# Patient Record
Sex: Male | Born: 1985 | Race: Black or African American | Hispanic: No | Marital: Single | Smoking: Current every day smoker
Health system: Southern US, Community
[De-identification: ages and names within clinical notes are randomized; demographics above are authoritative.]

## PROBLEM LIST (undated history)

## (undated) HISTORY — PX: APPENDECTOMY: SHX54

---
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
## 2013-05-29 ENCOUNTER — Emergency Department (HOSPITAL_COMMUNITY): Payer: Self-pay

## 2013-05-29 ENCOUNTER — Emergency Department (HOSPITAL_COMMUNITY)
Admission: EM | Admit: 2013-05-29 | Discharge: 2013-05-29 | Disposition: A | Discharge status: Home / Self Care | Payer: Self-pay | Attending: Emergency Medicine | Admitting: Emergency Medicine

## 2013-05-29 ENCOUNTER — Encounter (HOSPITAL_COMMUNITY): Payer: Self-pay | Admitting: Emergency Medicine

## 2013-05-29 DIAGNOSIS — R5383 Other fatigue: Secondary | ICD-10-CM

## 2013-05-29 DIAGNOSIS — J111 Influenza due to unidentified influenza virus with other respiratory manifestations: Secondary | ICD-10-CM | POA: Insufficient documentation

## 2013-05-29 DIAGNOSIS — F17200 Nicotine dependence, unspecified, uncomplicated: Secondary | ICD-10-CM | POA: Insufficient documentation

## 2013-05-29 DIAGNOSIS — R5381 Other malaise: Secondary | ICD-10-CM | POA: Insufficient documentation

## 2013-05-29 DIAGNOSIS — R197 Diarrhea, unspecified: Secondary | ICD-10-CM | POA: Insufficient documentation

## 2013-05-29 DIAGNOSIS — R51 Headache: Secondary | ICD-10-CM | POA: Insufficient documentation

## 2013-05-29 DIAGNOSIS — R6889 Other general symptoms and signs: Secondary | ICD-10-CM

## 2013-05-29 LAB — RAPID STREP SCREEN (MED CTR MEBANE ONLY): Streptococcus, Group A Screen (Direct): NEGATIVE

## 2013-05-29 MED ORDER — IBUPROFEN 800 MG PO TABS
800.0000 mg | ORAL_TABLET | Freq: Once | ORAL | Status: AC
  Administered 2013-05-29: 800 mg via ORAL
  Filled 2013-05-29: qty 1

## 2013-05-29 MED ORDER — ONDANSETRON HCL 4 MG PO TABS: 4.0000 mg | ORAL_TABLET | Freq: Four times a day (QID) | ORAL | Status: DC | PRN

## 2013-05-29 MED ORDER — ONDANSETRON 4 MG PO TBDP
4.0000 mg | ORAL_TABLET | Freq: Once | ORAL | Status: AC
  Administered 2013-05-29: 4 mg via ORAL
  Filled 2013-05-29: qty 1
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
## 2013-05-29 NOTE — ED Notes (Signed)
Pt reports this week, fever, runny nose, h/a, aching. Vomiting and diarrhea started today. Pt is a x 4. In NAD. Mask in place.

## 2013-05-29 NOTE — Discharge Instructions (Signed)
°Emergency Department Resource Guide °1) Find a Doctor and Pay Out of Pocket °Although you won't have to find out who is covered by your insurance plan, it is a good idea to ask around and get recommendations. You will then need to call the office and see if the doctor you have chosen will accept you as a new patient and what types of options they offer for patients who are self-pay. Some doctors offer discounts or will set up payment plans for their patients who do not have insurance, but you will need to ask so you aren't surprised when you get to your appointment. ° °2) Contact Your Local Health Department °Not all health departments have doctors that can see patients for sick visits, but many do, so it is worth a call to see if yours does. If you don't know where your local health department is, you can check in your phone book. The CDC also has a tool to help you locate your state's health department, and many state websites also have listings of all of their local health departments. ° °3) Find a Walk-in Clinic °If your illness is not likely to be very severe or complicated, you may want to try a walk in clinic. These are popping up all over the country in pharmacies, drugstores, and shopping centers. They're usually staffed by nurse practitioners or physician assistants that have been trained to treat common illnesses and complaints. They're usually fairly quick and inexpensive. However, if you have serious medical issues or chronic medical problems, these are probably not your best option. ° °No Primary Care Doctor: °- Call [facility] at  832-8000 - they can help you locate a primary care doctor that  accepts your insurance, provides certain services, etc. °- Physician Referral Service- 1-800-533-3463 ° °Chronic Pain Problems: °Organization         Address  Phone   Notes  °[facility]  (336) 297-2271 Patients need to be referred by their primary care doctor.  ° °Medication  Assistance: °Organization         Address  Phone   Notes  °[place] [facility] [address] °[place], [place] [address] (336) 641-8030 --Must be a resident of [place] °-- Must have NO insurance coverage whatsoever (no Medicaid/ Medicare, etc.) °-- The pt. MUST have a primary care doctor that directs their care regularly and follows them in the community °  °[facility]  (866) 331-1348   °[employer]  (888) 892-1162   ° °Agencies that provide inexpensive medical care: °Organization         Address  Phone   Notes  °[facility]  (336) 832-8035   °[facility]    (336) 832-7272   °[facility] [address] °[place], [place] [address] (336) 832-4777   °[facility] [address], °[place] (336) 271-4999   °[facility]    (336) 373-0678   °[facility]    (336) 272-1050   °[facility] ° [address], [place] Phone:  (336) 832-4444, Fax:  (336) 832-4440 Hours of Operation:  9 am - 6 pm, M-F.  Also accepts Medicaid/Medicare and self-pay.  °[facility] ° [address], [place] Phone: (336) 832-3150, Fax: (336) 832-3151. Hours of Operation:  8:30 am - 5:30 pm, M-F.  Also accepts Medicaid and self-pay.  °[facility] [place] [address], [place] Phone: (336) 878-6027   °[facility] [address], [place], [place] (336)723-1848, Ext. 123 Mondays & Thursdays: 7-9 AM.  First 15 patients are seen on a first come, first serve basis. °  ° °Medicaid-accepting [place] Providers: ° °Organization         Address  Phone   Notes  °[facility] [address], [place] (336) 641-2100 Also accepts self-pay patients.  °[facility] [address], [place] ° (336) 856-9996   °[facility] [address], [place]  (336) 288-8857   °[facility] [address] [place] [address], [place] (336) 299-7000   °Veita Bland [address], [place]  ° (336) 373-1557 Only accepts [place] Access Medicaid patients after they have their name applied to their card.  ° °Self-Pay (no insurance) in [place]: ° °Organization         Address  Phone   Notes  °Sickle Cell Patients, [facility] [address], [place] (336) 832-1970   °[facility] [address], [place] (336) 832-4400   °[facility] ° [address], [place] (336) 992-4800   °[facility]/Dr. Osei-Bonsu ° [address] [place] [address], [place] or [address], [place] (336) 841-8500 Phone number for both [place] and [place] locations is the same.  °[facility] [address], [place] (336) 299-0000   °[facility] [address] [place] [address], [place] or [address] (336) 852-7530 °(336) 878-2260   °[facility] [address], [place] (336) 350-1642, phone; (336) 294-5005, fax Sees patients 1st and 3rd Saturday of every month.  Must not qualify for public or private insurance (i.e. Medicaid, Medicare, [place] Health Choice, Veterans' Benefits) • Household income should be no more than 200% of the poverty level •The clinic cannot treat you if you are pregnant or think you are pregnant • Sexually transmitted diseases are not treated at the clinic.  ° ° °Dental Care: °Organization         Address  Phone  Notes  °[place] [facility] [address], [place] (336) 641-6152 Accepts children up to age 21 who are enrolled in Medicaid or [place] Health Choice; pregnant women with a Medicaid card; and children who have applied for Medicaid or [place] Health Choice, but were declined, whose parents can pay a reduced fee at time of service.  °[place]  [facility] [place]  [address], [place] (336) 641-7733 Accepts children up to age 21 who are enrolled in Medicaid or [place] Health Choice; pregnant women with a Medicaid card; and children who have applied for Medicaid or [place] Health Choice, but were declined, whose parents can pay a reduced fee at time of service.  °[facility] ° [address], [place] (336) 641-4533 Patients are seen by appointment only. Walk-ins are not accepted. [facility] will see patients 18 years of age and older. °Monday - Tuesday (8am-5pm) °Most Wednesdays (8:30-5pm) °$30 per visit, cash only  °[facility] ° [address], [place] (336) 641-4533 Patients are seen by appointment only. Walk-ins are not accepted. [facility] will see patients 18 years of age and older. °One   Wednesday Evening (Monthly: Volunteer Based).  $30 per visit, cash only  °[facility]  (919) 537-3737 for adults; Children under age 4, call Graduate Pediatric Dentistry at (919) [phone]. Children aged 4-14, please call (919) [phone] to request a pediatric application. ° Dental services are provided in all areas of dental care including fillings, crowns and bridges, complete and partial dentures, implants, gum treatment, root canals, and extractions. Preventive care is also provided. Treatment is provided to both adults and children. °Patients are selected via a lottery and there is often a waiting list. °  °[facility] [address], °[place] ° (336) 763-8833 [email or web address] °  °[facility] [address], [place], [place] (336)723-1848, Ext. 123 Second and Fourth Thursday of each month, opens at 6:30 AM; Clinic ends at 9 AM.  Patients are seen on a first-come first-served basis, and a limited number are seen during each clinic.  ° °[facility] ° [address], [place], [place] (336) 723-7904    Eligibility Requirements °You must have lived in [place], [place], or [place] counties for at least the last three months. °  You cannot be eligible for state or federal sponsored healthcare insurance, including Veterans Administration, Medicaid, or Medicare. °  You generally cannot be eligible for healthcare insurance through your employer.  °  How to apply: °Eligibility screenings are held every Tuesday and Wednesday afternoon from 1:00 pm until 4:00 pm. You do not need an appointment for the interview!  °[facility] [address], [place], [place] 336-631-2330   °[facility] [facility]  336-342-8273   °[place] [facility]  336-703-3100   °[facility]  336-570-6415   ° °Behavioral Health Resources in the Community: °Intensive Outpatient Programs °Organization         Address  Phone  Notes  °[facility] [address], [place], [place] 336-878-6098   °[facility] [address], [place], [place] 336-832-9800   °[facility] [address], [place], [place] ° 336-882-2125   °[facility] [address],  °[place], [place] 1-800-853-5163 or 336-641-4981   °Substance Abuse Resources °Organization         Address  Phone  Notes  °[facility]  336-882-2125   °[facility]  336-784-9470   °[facility]  336-285-9073   °[facility]  336-845-3988   °Residential & Outpatient Substance Abuse Program  1-800-659-3381   °Psychological Services °Organization         Address  Phone  Notes  °[facility]  336- 832-9600   °[facility]  336- 378-7881   °[facility] [address], [place] 1-800-853-5163 or 336-641-4981   ° °Mobile Crisis Teams °Organization         Address  Phone  Notes  °[facility], Mobile Crisis Care Unit  1-877-626-1772   °Assertive °[facility] ° [address]  [place], [place] 336-834-9664   °Sharon DeEsch [address] °[place] 336-554-5454   ° °Self-Help/Support Groups °Organization         Address  Phone             Notes  °[facility] [place] - variety of support groups  336- 373-1402 Call for more information  °Narcotics Anonymous (NA), [facility] [address], °[place]  2 meetings at this location  ° °  Residential Treatment Programs °Organization         Address  Phone  Notes  °[facility] [address],    °[place]  1-866-801-8205   °[facility] ° [address], [place], [place] 704-293-8524   °[facility] [address], [place] 336-845-3988 Admissions: 8am-3pm M-F  °[facility] [address]    °[place], [place] 336-841-1104   °[facility] [address], [place], [place] 336-379-7146   °[facility] [address]  °[place], [place] 336-285-9073   °[facility] - Intensive Outpatient [address], [place], [place] 336-852-3033   °[facility] [address]  °[place], [place] 1-877-615-2722 or 336-784-9470   °[facility] [address] [place], [place] 336-227-7417 Accepts Medicaid  °[facility] [address]  °[place] 1-800-659-3381 Substance Abuse/Addiction Treatment  ° °[place] Behavioral Health Resources °Organization         Address  Phone  Notes  °[facility]  (888) 581-9988   °Julie Brannon, PhD [address] [place], [place]   (336) 349-5553 or (336) 951-0000   °[facility]   [address] °[place], [place] (336) 349-4454   °[facility] [address], [place], [place] (336) 342-8316 Insurance/Medicaid/sponsorship through [facility]  °[facility] [address]                                    [place], [place] (336) 342-8316 Therapy/tele-psych/case    °[facility] [address]  ° [place],  (336) 349-2233    °Dr. Arfeen  (336) 349-4544   °[facility] [place]  [employer] [place] [facility] 1) [address], [place] °2) [address], [place] °3)  [address], [place] (336) 349-3220 °(336) 342-7768 ° °(336) 342-8140   °[place] [facility] (336) 342-1394 or (336) 342-3537 (After Hours)    ° ° °

## 2013-05-31 LAB — CULTURE, GROUP A STREP

## 2014-07-20 ENCOUNTER — Emergency Department (INDEPENDENT_AMBULATORY_CARE_PROVIDER_SITE_OTHER)
Admission: EM | Admit: 2014-07-20 | Discharge: 2014-07-20 | Disposition: A | Discharge status: Home / Self Care | Payer: Self-pay | Source: Home / Self Care | Attending: Family Medicine | Admitting: Family Medicine

## 2014-07-20 ENCOUNTER — Encounter (HOSPITAL_COMMUNITY): Payer: Self-pay | Admitting: Emergency Medicine

## 2014-07-20 ENCOUNTER — Other Ambulatory Visit (HOSPITAL_COMMUNITY)
Admission: RE | Admit: 2014-07-20 | Discharge: 2014-07-20 | Disposition: A | Discharge status: Home / Self Care | Payer: Self-pay | Source: Ambulatory Visit | Attending: Family Medicine | Admitting: Family Medicine

## 2014-07-20 DIAGNOSIS — Z113 Encounter for screening for infections with a predominantly sexual mode of transmission: Secondary | ICD-10-CM

## 2014-07-20 NOTE — Discharge Instructions (Signed)

## 2014-07-20 NOTE — ED Notes (Signed)
Patient reports he would like to be checked for STDs. Denies having any symptoms. Just wants to be checked for everything.

## 2014-07-20 NOTE — ED Provider Notes (Signed)
CSN: [id number]     Arrival date & time 07/20/14  0809 History   None    Chief Complaint  Patient presents with  . Exposure to STD   (Consider location/radiation/quality/duration/timing/severity/associated sxs/prior Treatment) HPI          29 year old heterosexual male presents requesting STD screening. Denies any exposures has no symptoms. Denies high-risk sexual behavior  History reviewed. No pertinent past medical history. Past Surgical History  Procedure Laterality Date  . Appendectomy     No family history on file. History  Substance Use Topics  . Smoking status: Current Every Day Smoker  . Smokeless tobacco: Not on file  . Alcohol Use: No    Review of Systems  Constitutional: Negative for fever, chills and fatigue.  HENT: Negative for sore throat.   Eyes: Negative for visual disturbance.  Respiratory: Negative for cough and shortness of breath.   Cardiovascular: Negative for chest pain, palpitations and leg swelling.  Gastrointestinal: Negative for nausea, vomiting, abdominal pain, diarrhea and constipation.  Genitourinary: Negative for dysuria, urgency, frequency and hematuria.  Musculoskeletal: Negative for myalgias, arthralgias, neck pain and neck stiffness.  Skin: Negative for rash.  Neurological: Negative for dizziness, weakness and light-headedness.  All other systems reviewed and are negative.   Allergies  Review of patient's allergies indicates no known allergies.  Home Medications   Prior to Admission medications   Medication Sig Start Date End Date Taking? Authorizing Provider  Acetaminophen (CHLORASEPTIC SORE THROAT PO) Take 1-2 sprays by mouth every 4 (four) hours as needed (sore throat).    Historical Provider, MD  acetaminophen (TYLENOL) 500 MG tablet Take 500 mg by mouth every 6 (six) hours as needed.    Historical Provider, MD  guaiFENesin (MUCINEX) 600 MG 12 hr tablet Take 600 mg by mouth 2 (two) times daily.    Historical Provider, MD   ondansetron (ZOFRAN) 4 MG tablet Take 1 tablet (4 mg total) by mouth every 6 (six) hours as needed for nausea or vomiting. 05/29/13   Pricilla LovelessScott Goldston, MD  Phenyleph-Doxylamine-DM-APAP (TYLENOL COLD MULTI-SYMPTOM) 5-6.25-10-325 MG/15ML LIQD Take 15 mLs by mouth every 12 (twelve) hours as needed (cold).    Historical Provider, MD   BP 111/75 mmHg  Pulse 64  Temp(Src) 97.7 F (36.5 C) (Oral)  Resp 16  SpO2 98% Physical Exam  Constitutional: He is oriented to person, place, and time. He appears well-developed and well-nourished. No distress.  HENT:  Head: Normocephalic.  Pulmonary/Chest: Effort normal. No respiratory distress.  Neurological: He is alert and oriented to person, place, and time. Coordination normal.  Skin: Skin is warm and dry. No rash noted. He is not diaphoretic.  Psychiatric: He has a normal mood and affect. Judgment normal.  Nursing note and vitals reviewed.   ED Course  Procedures (including critical care time) Labs Review Labs Reviewed  RPR  HIV ANTIBODY (ROUTINE TESTING)  URINE CYTOLOGY ANCILLARY ONLY    Imaging Review No results found.   MDM   1. Screen for STD (sexually transmitted disease)    HIV, RPR, and urine cytology sent. We will call him with any positive results.    Graylon GoodZachary H Shanan Mcmiller, PA-C 07/20/14 [phone]

## 2014-07-21 LAB — URINE CYTOLOGY ANCILLARY ONLY
Chlamydia: NEGATIVE
Neisseria Gonorrhea: NEGATIVE
Trichomonas: NEGATIVE

## 2014-07-21 LAB — RPR: RPR Ser Ql: NONREACTIVE

## 2014-07-21 LAB — HIV ANTIBODY (ROUTINE TESTING W REFLEX): HIV Screen 4th Generation wRfx: NONREACTIVE

## 2014-10-25 ENCOUNTER — Emergency Department (INDEPENDENT_AMBULATORY_CARE_PROVIDER_SITE_OTHER): Payer: Self-pay

## 2014-10-25 ENCOUNTER — Encounter (HOSPITAL_COMMUNITY): Payer: Self-pay | Admitting: Emergency Medicine

## 2014-10-25 ENCOUNTER — Emergency Department (INDEPENDENT_AMBULATORY_CARE_PROVIDER_SITE_OTHER)
Admission: EM | Admit: 2014-10-25 | Discharge: 2014-10-25 | Disposition: A | Discharge status: Home / Self Care | Payer: Self-pay | Source: Home / Self Care | Attending: Family Medicine | Admitting: Family Medicine

## 2014-10-25 DIAGNOSIS — S338XXA Sprain of other parts of lumbar spine and pelvis, initial encounter: Secondary | ICD-10-CM

## 2014-10-25 DIAGNOSIS — S39012A Strain of muscle, fascia and tendon of lower back, initial encounter: Secondary | ICD-10-CM

## 2014-10-25 LAB — POCT URINALYSIS DIP (DEVICE)
Bilirubin Urine: NEGATIVE
GLUCOSE, UA: NEGATIVE mg/dL
Hgb urine dipstick: NEGATIVE
Ketones, ur: NEGATIVE mg/dL
Leukocytes, UA: NEGATIVE
NITRITE: NEGATIVE
Protein, ur: NEGATIVE mg/dL
SPECIFIC GRAVITY, URINE: 1.02 (ref 1.005–1.030)
UROBILINOGEN UA: 0.2 mg/dL (ref 0.0–1.0)
pH: 7.5 (ref 5.0–8.0)

## 2014-10-25 MED ORDER — CYCLOBENZAPRINE HCL 10 MG PO TABS: 10.0000 mg | ORAL_TABLET | Freq: Every evening | ORAL | Status: DC | PRN

## 2014-10-25 MED ORDER — NAPROXEN 500 MG PO TABS: 500.0000 mg | ORAL_TABLET | Freq: Two times a day (BID) | ORAL | Status: DC

## 2014-10-25 NOTE — ED Notes (Signed)
C/o intermittent back pain x6 months +++ associated w/bilateral hip pain Pain is getting worse; 6/10 at the moment Denies inj/trauma, urinary sx Alert, no signs of acute distress.

## 2014-10-25 NOTE — Discharge Instructions (Signed)
Thank you for coming in today. Come back or go to the emergency room if you notice new weakness new numbness problems walking or bowel or bladder problems.  Lumbosacral Strain Lumbosacral strain is a strain of any of the parts that make up your lumbosacral vertebrae. Your lumbosacral vertebrae are the bones that make up the lower third of your backbone. Your lumbosacral vertebrae are held together by muscles and tough, fibrous tissue (ligaments).  CAUSES  A sudden blow to your back can cause lumbosacral strain. Also, anything that causes an excessive stretch of the muscles in the low back can cause this strain. This is typically seen when people exert themselves strenuously, fall, lift heavy objects, bend, or crouch repeatedly. RISK FACTORS  Physically demanding work.  Participation in pushing or pulling sports or sports that require a sudden twist of the back (tennis, golf, baseball).  Weight lifting.  Excessive lower back curvature.  Forward-tilted pelvis.  Weak back or abdominal muscles or both.  Tight hamstrings. SIGNS AND SYMPTOMS  Lumbosacral strain may cause pain in the area of your injury or pain that moves (radiates) down your leg.  DIAGNOSIS Your health care provider can often diagnose lumbosacral strain through a physical exam. In some cases, you may need tests such as X-ray exams.  TREATMENT  Treatment for your lower back injury depends on many factors that your clinician will have to evaluate. However, most treatment will include the use of anti-inflammatory medicines. HOME CARE INSTRUCTIONS   Avoid hard physical activities (tennis, racquetball, waterskiing) if you are not in proper physical condition for it. This may aggravate or create problems.  If you have a back problem, avoid sports requiring sudden body movements. Swimming and walking are generally safer activities.  Maintain good posture.  Maintain a healthy weight.  For acute conditions, you may put ice on  the injured area.  Put ice in a plastic bag.  Place a towel between your skin and the bag.  Leave the ice on for 20 minutes, 2-3 times a day.  When the low back starts healing, stretching and strengthening exercises may be recommended. SEEK MEDICAL CARE IF:  Your back pain is getting worse.  You experience severe back pain not relieved with medicines. SEEK IMMEDIATE MEDICAL CARE IF:   You have numbness, tingling, weakness, or problems with the use of your arms or legs.  There is a change in bowel or bladder control.  You have increasing pain in any area of the body, including your belly (abdomen).  You notice shortness of breath, dizziness, or feel faint.  You feel sick to your stomach (nauseous), are throwing up (vomiting), or become sweaty.  You notice discoloration of your toes or legs, or your feet get very cold. MAKE SURE YOU:   Understand these instructions.  Will watch your condition.  Will get help right away if you are not doing well or get worse. Document Released: 01/24/2005 Document Revised: 04/21/2013 Document Reviewed: 12/03/2012 Exit[employer] Patient Information 2015 [employer]. This information is not intended to replace advice given to you by your health care provider. Make sure you discuss any questions you have with your health care provider.  

## 2014-10-25 NOTE — ED Provider Notes (Signed)
Chris Blankenship is a 29 y.o. male who presents to Urgent Care today for back and hip pain. The last 6 months patient has had intermittent severe at times pain in his left low back and pain into his bilateral lateral hips. This seems to be worse after he is at work. He denies any radiating pain weakness or numbness bowel bladder dysfunction or difficulty walking. He's tried Aleve and ibuprofen which helped temporarily. No urinary frequency urgency or dysuria. No injury or trauma. No fevers or chills. Pain is moderate.   History reviewed. No pertinent past medical history. History reviewed. No pertinent past surgical history. History  Substance Use Topics  . Smoking status: Current Every Day Smoker -- 1.00 packs/day    Types: Cigarettes  . Smokeless tobacco: Not on file  . Alcohol Use: Yes   ROS as above Medications: No current facility-administered medications for this encounter.   Current Outpatient Prescriptions  Medication Sig Dispense Refill  . cyclobenzaprine (FLEXERIL) 10 MG tablet Take 1 tablet (10 mg total) by mouth at bedtime as needed for muscle spasms. 20 tablet 0  . naproxen (NAPROSYN) 500 MG tablet Take 1 tablet (500 mg total) by mouth 2 (two) times daily. 30 tablet 0   No Known Allergies   Exam:  BP 117/69 mmHg  Pulse 86  Temp(Src) 98.3 F (36.8 C) (Oral)  Resp 12  SpO2 99% Gen: Well NAD HEENT: EOMI,  MMM Lungs: Normal work of breathing. CTABL Heart: RRR no MRG Abd: NABS, Soft. Nondistended, Nontender no CV angle tenderness to percussion Exts: Brisk capillary refill, warm and well perfused.  Back: Nontender to midline. Minimally tender left lumbar paraspinal. Lumbar ROM is limited in extension.  Hips are mildly tender bilateral area just inferior to the iliac crest Hip motion is normal. Negative FABER and FADIR tests.    Results for orders placed or performed during the hospital encounter of 10/25/14 (from the past 24 hour(s))  POCT urinalysis dip (device)      Status: None   Collection Time: 10/25/14  3:07 PM  Result Value Ref Range   Glucose, UA NEGATIVE NEGATIVE mg/dL   Bilirubin Urine NEGATIVE NEGATIVE   Ketones, ur NEGATIVE NEGATIVE mg/dL   Specific Gravity, Urine 1.020 1.005 - 1.030   Hgb urine dipstick NEGATIVE NEGATIVE   pH 7.5 5.0 - 8.0   Protein, ur NEGATIVE NEGATIVE mg/dL   Urobilinogen, UA 0.2 0.0 - 1.0 mg/dL   Nitrite NEGATIVE NEGATIVE   Leukocytes, UA NEGATIVE NEGATIVE   Dg Lumbar Spine Complete  10/25/2014   CLINICAL DATA:  Back pain off and on for 6 months, no known injury, initial encounter  EXAM: LUMBAR SPINE - COMPLETE 4+ VIEW  COMPARISON:  None.  FINDINGS: There is no evidence of lumbar spine fracture. Alignment is normal. Intervertebral disc spaces are maintained.  IMPRESSION: No acute abnormality noted.   Electronically Signed   By: Alcide Clever M.D.   On: 10/25/2014 15:18    Assessment and Plan: 29 y.o. male with back and hip pain. The etiology at this time is some unclear. This may be simply a series of myofascial strains a muscle spasms. However he may have a more serious process such as a spondyloarthropathy, given his pain is worse with extension and he is a young man.  Plan for trial of NSAIDs and Flexeril watchful waiting and follow up with sports medicine if not better.  Discussed warning signs or symptoms. Please see discharge instructions. Patient expresses understanding.  Rodolph BongEvan S Bishop Vanderwerf, MD 10/25/14 [id number]

## 2014-12-26 ENCOUNTER — Encounter (HOSPITAL_COMMUNITY): Payer: Self-pay | Admitting: Emergency Medicine

## 2014-12-26 ENCOUNTER — Emergency Department (HOSPITAL_COMMUNITY)
Admission: EM | Admit: 2014-12-26 | Discharge: 2014-12-26 | Disposition: A | Discharge status: Home / Self Care | Payer: BLUE CROSS/BLUE SHIELD | Attending: Emergency Medicine | Admitting: Emergency Medicine

## 2014-12-26 DIAGNOSIS — M545 Low back pain, unspecified: Secondary | ICD-10-CM

## 2014-12-26 DIAGNOSIS — M25551 Pain in right hip: Secondary | ICD-10-CM | POA: Diagnosis not present

## 2014-12-26 DIAGNOSIS — Z79899 Other long term (current) drug therapy: Secondary | ICD-10-CM | POA: Diagnosis not present

## 2014-12-26 DIAGNOSIS — Z720 Tobacco use: Secondary | ICD-10-CM | POA: Diagnosis not present

## 2014-12-26 DIAGNOSIS — M25552 Pain in left hip: Secondary | ICD-10-CM | POA: Diagnosis not present

## 2014-12-26 MED ORDER — TRAMADOL HCL 50 MG PO TABS: 50.0000 mg | ORAL_TABLET | Freq: Four times a day (QID) | ORAL | Status: DC | PRN

## 2014-12-26 MED ORDER — METHOCARBAMOL 500 MG PO TABS: 500.0000 mg | ORAL_TABLET | Freq: Two times a day (BID) | ORAL | Status: DC

## 2014-12-26 MED ORDER — HYDROCODONE-ACETAMINOPHEN 5-325 MG PO TABS
1.0000 | ORAL_TABLET | Freq: Once | ORAL | Status: AC
  Administered 2014-12-26: 1 via ORAL
  Filled 2014-12-26: qty 1

## 2014-12-26 MED ORDER — DIAZEPAM 5 MG PO TABS
5.0000 mg | ORAL_TABLET | Freq: Once | ORAL | Status: AC
  Administered 2014-12-26: 5 mg via ORAL
  Filled 2014-12-26: qty 1

## 2014-12-26 MED ORDER — ONDANSETRON 4 MG PO TBDP
4.0000 mg | ORAL_TABLET | Freq: Once | ORAL | Status: AC
  Administered 2014-12-26: 4 mg via ORAL
  Filled 2014-12-26: qty 1

## 2014-12-26 MED ORDER — KETOROLAC TROMETHAMINE 60 MG/2ML IM SOLN
60.0000 mg | Freq: Once | INTRAMUSCULAR | Status: AC
  Administered 2014-12-26: 60 mg via INTRAMUSCULAR
  Filled 2014-12-26: qty 2

## 2014-12-26 MED ORDER — MELOXICAM 7.5 MG PO TABS: 7.5000 mg | ORAL_TABLET | Freq: Every day | ORAL | Status: DC

## 2014-12-26 NOTE — ED Notes (Signed)
Pt c/o mid back pain that radiates down to hip ongoing for months. Pt seen at urgent care for same and given medications but no relief. Pt denies injury. Pt ambulatory. Denies urinary symptoms.

## 2014-12-26 NOTE — Discharge Instructions (Signed)
Back Pain, Adult Low back pain is very common. About 1 in 5 people have back pain.The cause of low back pain is rarely dangerous. The pain often gets better over time.About half of people with a sudden onset of back pain feel better in just 2 weeks. About 8 in 10 people feel better by 6 weeks.  CAUSES Some common causes of back pain include:  Strain of the muscles or ligaments supporting the spine.  Wear and tear (degeneration) of the spinal discs.  Arthritis.  Direct injury to the back. DIAGNOSIS Most of the time, the direct cause of low back pain is not known.However, back pain can be treated effectively even when the exact cause of the pain is unknown.Answering your caregiver's questions about your overall health and symptoms is one of the most accurate ways to make sure the cause of your pain is not dangerous. If your caregiver needs more information, he or she may order lab work or imaging tests (X-rays or MRIs).However, even if imaging tests show changes in your back, this usually does not require surgery. HOME CARE INSTRUCTIONS For many people, back pain returns.Since low back pain is rarely dangerous, it is often a condition that people can learn to manageon their own.   Remain active. It is stressful on the back to sit or stand in one place. Do not sit, drive, or stand in one place for more than 30 minutes at a time. Take short walks on level surfaces as soon as pain allows.Try to increase the length of time you walk each day.  Do not stay in bed.Resting more than 1 or 2 days can delay your recovery.  Do not avoid exercise or work.Your body is made to move.It is not dangerous to be active, even though your back may hurt.Your back will likely heal faster if you return to being active before your pain is gone.  Pay attention to your body when you bend and lift. Many people have less discomfortwhen lifting if they bend their knees, keep the load close to their bodies,and  avoid twisting. Often, the most comfortable positions are those that put less stress on your recovering back.  Find a comfortable position to sleep. Use a firm mattress and lie on your side with your knees slightly bent. If you lie on your back, put a pillow under your knees.  Only take over-the-counter or prescription medicines as directed by your caregiver. Over-the-counter medicines to reduce pain and inflammation are often the most helpful.Your caregiver may prescribe muscle relaxant drugs.These medicines help dull your pain so you can more quickly return to your normal activities and healthy exercise.  Put ice on the injured area.  Put ice in a plastic bag.  Place a towel between your skin and the bag.  Leave the ice on for 15-20 minutes, 03-04 times a day for the first 2 to 3 days. After that, ice and heat may be alternated to reduce pain and spasms.  Ask your caregiver about trying back exercises and gentle massage. This may be of some benefit.  Avoid feeling anxious or stressed.Stress increases muscle tension and can worsen back pain.It is important to recognize when you are anxious or stressed and learn ways to manage it.Exercise is a great option. SEEK MEDICAL CARE IF:  You have pain that is not relieved with rest or medicine.  You have pain that does not improve in 1 week.  You have new symptoms.  You are generally not feeling well. SEEK   IMMEDIATE MEDICAL CARE IF:   You have pain that radiates from your back into your legs.  You develop new bowel or bladder control problems.  You have unusual weakness or numbness in your arms or legs.  You develop nausea or vomiting.  You develop abdominal pain.  You feel faint. Document Released: 04/16/2005 Document Revised: 10/16/2011 Document Reviewed: 08/18/2013 Exit[employer] Patient Information 2015 [employer]. This information is not intended to replace advice given to you by your health care provider. Make sure you  discuss any questions you have with your health care provider.  

## 2014-12-26 NOTE — ED Provider Notes (Signed)
CSN: [id number]     Arrival date & time 12/26/14  7829 History   First MD Initiated Contact with Patient 12/26/14 [phone]     Chief Complaint  Patient presents with  . Back Pain  . Hip Pain     (Consider location/radiation/quality/duration/timing/severity/associated sxs/prior Treatment) HPI   Chris Blankenship 29 y.o.male  PCP: No primary care provider on file.  Blood pressure 115/71, pulse 80, temperature 97.9 F (36.6 C), temperature source Oral, resp. rate 18, height 5\' 11"  (1.803 m), weight 165 lb (74.844 kg), SpO2 100 %.  SIGNIFICANT PMH: None CHIEF COMPLAINT: back pain and bilateral hip pain  When: symptoms have been going on for years, worse in the past few months. He didn't sleep last night because his symptoms became so severe. How: He denies any injuries but he stands all shift for his job with only two 10 minute sitting breaks Duration: worse since last night Location: low back- midline and paraspinal Radiation: alternates radiating between the right and left hip Quality: spasm, shooting and pressure Alleviating factors: resting, laying and sitting Worsening factors: walking, quick movements and certain positions Treatments tried: he was given pain medication and muscle relaxers by Urgent care on 09/2014, but reports no alleviation of symptoms. He has not seen an orthopedic provider. Associated Symptoms: none Negative ROS: The patient denies diaphoresis, fever, headache, weakness (general or focal), confusion, change of vision,  neck pain, dysphagia, aphagia, chest pain, shortness of breath,   abdominal pains, nausea, vomiting, diarrhea, lower extremity swelling, rash, or IV drug use   History reviewed. No pertinent past medical history. Past Surgical History  Procedure Laterality Date  . Appendectomy     No family history on file. Social History  Substance Use Topics  . Smoking status: Current Every Day Smoker -- 1.00 packs/day    Types: Cigarettes  . Smokeless tobacco:  None  . Alcohol Use: Yes    Review of Systems  10 Systems reviewed and are negative for acute change except as noted in the HPI.     Allergies  Review of patient's allergies indicates no known allergies.  Home Medications   Prior to Admission medications   Medication Sig Start Date End Date Taking? Authorizing Provider  cyclobenzaprine (FLEXERIL) 10 MG tablet Take 1 tablet (10 mg total) by mouth at bedtime as needed for muscle spasms. 10/25/14   Rodolph Bong, MD  meloxicam (MOBIC) 7.5 MG tablet Take 1 tablet (7.5 mg total) by mouth daily. 12/26/14   Carliyah Cotterman Neva Seat, PA-C  methocarbamol (ROBAXIN) 500 MG tablet Take 1 tablet (500 mg total) by mouth 2 (two) times daily. 12/26/14   Amirah Goerke Neva Seat, PA-C  naproxen (NAPROSYN) 500 MG tablet Take 1 tablet (500 mg total) by mouth 2 (two) times daily. 10/25/14   Rodolph Bong, MD  traMADol (ULTRAM) 50 MG tablet Take 1 tablet (50 mg total) by mouth every 6 (six) hours as needed. 12/26/14   Ralf Konopka Neva Seat, PA-C   BP 121/86 mmHg  Pulse 67  Temp(Src) 97.9 F (36.6 C) (Oral)  Resp 18  Ht 5\' 11"  (1.803 m)  Wt 165 lb (74.844 kg)  BMI 23.02 kg/m2  SpO2 99% Physical Exam  Constitutional: He appears well-developed and well-nourished. No distress.  HENT:  Head: Normocephalic and atraumatic.  Eyes: Pupils are equal, round, and reactive to light.  Neck: Normal range of motion. Neck supple.  Cardiovascular: Normal rate and regular rhythm.   Pulmonary/Chest: Effort normal.  Abdominal: Soft.  Musculoskeletal:  Pt has symmetrical and  physiologic strength to bilateral lower extremities.  Neurosensory function adequate to both legs Skin color is normal. Skin is warm and moist.  No step off deformity appreciated  Pt is able to ambulate but with some discomfort.  No crepitus, laceration, effusion, induration, lesions,  appreciated Pedal pulses are symmetrical and palpable bilaterally  He exhibits tenderness across the low back. No clonus on  dorsiflextion   Neurological: He is alert.  Skin: Skin is warm and dry.  Nursing note and vitals reviewed.   ED Course  Procedures (including critical care time) Labs Review Labs Reviewed - No data to display  Imaging Review No results found. I have personally reviewed and evaluated these images and lab results as part of my medical decision-making.   EKG Interpretation None      MDM   Final diagnoses:  Bilateral low back pain without sciatica   Medications  HYDROcodone-acetaminophen (NORCO/VICODIN) 5-325 MG per tablet 1 tablet (not administered)  diazepam (VALIUM) tablet 5 mg (5 mg Oral Given 12/26/14 1007)  ketorolac (TORADOL) injection 60 mg (60 mg Intramuscular Given 12/26/14 1003)  ondansetron (ZOFRAN-ODT) disintegrating tablet 4 mg (4 mg Oral Given 12/26/14 1006)   Patient has had significant relief with medication given in the ED, still some pain--- referral given for Dr. Magnus Ivan (ortho)  29 y.o.Chris Blankenship's  with back pain.   No neurological deficits and normal neuro exam. No loss of bowel or bladder control. No concern for cauda equina at this time base on HPI and physical exam findings. No fever, night sweats, weight loss, h/o cancer, IVDU. The patient can walk with some discomfort.   Patient Plan 1. Medications: NSAIDs and/or muscle relaxer. Cont usual home medications unless otherwise directed. 2. Treatment: rest, drink plenty of fluids, gentle stretching as discussed, alternate ice and heat  3. Follow Up: Please followup with your primary doctor for discussion of your diagnoses and further evaluation after today's visit; if you do not have a primary care doctor use the resource guide provided to find one  Advised to follow-up with the orthopedist if symptoms do not start to resolve in the next 2-3 days. If develop loss of bowel or urinary control return to the ED as soon as possible for further evaluation. To take the medications as prescribed as they can cause  harm if not taken appropriately.   Vital signs are stable at discharge. Filed Vitals:   12/26/14 1030  BP: 121/86  Pulse: 67  Temp:   Resp: 18    Patient/guardian has voiced understanding and agreed to follow-up with the PCP or specialist.         Marlon Pel, PA-C 12/26/14 1046  Gerhard Munch, MD 12/26/14 1235

## 2015-01-05 ENCOUNTER — Encounter (HOSPITAL_COMMUNITY): Payer: Self-pay | Admitting: Emergency Medicine

## 2015-06-08 ENCOUNTER — Encounter (HOSPITAL_COMMUNITY): Payer: Self-pay | Admitting: *Deleted

## 2015-06-08 ENCOUNTER — Emergency Department (HOSPITAL_COMMUNITY)
Admission: EM | Admit: 2015-06-08 | Discharge: 2015-06-08 | Disposition: A | Discharge status: Home / Self Care | Payer: BLUE CROSS/BLUE SHIELD | Attending: Emergency Medicine | Admitting: Emergency Medicine

## 2015-06-08 DIAGNOSIS — R51 Headache: Secondary | ICD-10-CM | POA: Diagnosis not present

## 2015-06-08 DIAGNOSIS — R110 Nausea: Secondary | ICD-10-CM | POA: Diagnosis not present

## 2015-06-08 DIAGNOSIS — R6889 Other general symptoms and signs: Secondary | ICD-10-CM

## 2015-06-08 DIAGNOSIS — J029 Acute pharyngitis, unspecified: Secondary | ICD-10-CM | POA: Insufficient documentation

## 2015-06-08 DIAGNOSIS — Z791 Long term (current) use of non-steroidal anti-inflammatories (NSAID): Secondary | ICD-10-CM | POA: Insufficient documentation

## 2015-06-08 DIAGNOSIS — Z79899 Other long term (current) drug therapy: Secondary | ICD-10-CM | POA: Diagnosis not present

## 2015-06-08 DIAGNOSIS — R509 Fever, unspecified: Secondary | ICD-10-CM | POA: Diagnosis present

## 2015-06-08 DIAGNOSIS — F17210 Nicotine dependence, cigarettes, uncomplicated: Secondary | ICD-10-CM | POA: Insufficient documentation

## 2015-06-08 LAB — CBC
HCT: 42.8 % (ref 39.0–52.0)
Hemoglobin: 14.8 g/dL (ref 13.0–17.0)
MCH: 28.6 pg (ref 26.0–34.0)
MCHC: 34.6 g/dL (ref 30.0–36.0)
MCV: 82.6 fL (ref 78.0–100.0)
PLATELETS: 214 10*3/uL (ref 150–400)
RBC: 5.18 MIL/uL (ref 4.22–5.81)
RDW: 12.2 % (ref 11.5–15.5)
WBC: 5.2 10*3/uL (ref 4.0–10.5)

## 2015-06-08 LAB — BASIC METABOLIC PANEL
ANION GAP: 11 (ref 5–15)
BUN: 12 mg/dL (ref 6–20)
CO2: 23 mmol/L (ref 22–32)
Calcium: 9.2 mg/dL (ref 8.9–10.3)
Chloride: 104 mmol/L (ref 101–111)
Creatinine, Ser: 1.03 mg/dL (ref 0.61–1.24)
GLUCOSE: 93 mg/dL (ref 65–99)
Potassium: 4.1 mmol/L (ref 3.5–5.1)
Sodium: 138 mmol/L (ref 135–145)

## 2015-06-08 MED ORDER — ACETAMINOPHEN 325 MG PO TABS
650.0000 mg | ORAL_TABLET | Freq: Once | ORAL | Status: AC
  Administered 2015-06-08: 650 mg via ORAL
  Filled 2015-06-08: qty 2

## 2015-06-08 MED ORDER — PROMETHAZINE-DM 6.25-15 MG/5ML PO SYRP: 5.0000 mL | ORAL_SOLUTION | Freq: Four times a day (QID) | ORAL | Status: DC | PRN

## 2015-06-08 MED ORDER — OSELTAMIVIR PHOSPHATE 75 MG PO CAPS: 75.0000 mg | ORAL_CAPSULE | Freq: Two times a day (BID) | ORAL | Status: DC

## 2015-06-08 NOTE — ED Notes (Signed)
Pt reports "diarrhea" two days ago and reports nausea every day since Monday.

## 2015-06-08 NOTE — ED Provider Notes (Signed)
CSN: [id number]     Arrival date & time 06/08/15  1837 History  By signing my name below, I, Murriel Hopper, attest that this documentation has been prepared under the direction and in the presence of Fayrene Helper, PA-C.  Electronically Signed: Murriel Hopper, ED Scribe. 06/08/2015. 8:40 PM.   Chief Complaint  Patient presents with  . Fever  . Influenza      Patient is a 30 y.o. male presenting with fever and flu symptoms. The history is provided by the patient. No language interpreter was used.  Fever Associated symptoms: headaches, nausea and sore throat   Associated symptoms: no rhinorrhea and no vomiting   Influenza Presenting symptoms: fever, headache, nausea and sore throat   Presenting symptoms: no rhinorrhea and no vomiting     HPI Comments: Chris Blankenship is a 30 y.o. male who presents to the Emergency Department complaining of flu-like symptoms for two days. Pt reports having constant generalized body aches, sore throat, headache, intermittent fever, and nausea since his symptoms began. Pt denies getting a flu shot this year, denies any positive sick contacts. Pt states he has been taking Nyquil at night to sleep with no relief. Pt reports that out of all of his symptoms, his headache is bothering him the most. Pt denies rhinorrhea, vomiting. Does report feeling dehydrated but able to tolerates PO.  Pt is a smoker.     History reviewed. No pertinent past medical history. Past Surgical History  Procedure Laterality Date  . Appendectomy     History reviewed. No pertinent family history. Social History  Substance Use Topics  . Smoking status: Current Every Day Smoker -- 1.00 packs/day    Types: Cigarettes  . Smokeless tobacco: None  . Alcohol Use: Yes    Review of Systems  Constitutional: Positive for fever.  HENT: Positive for sore throat. Negative for rhinorrhea.   Gastrointestinal: Positive for nausea. Negative for vomiting.  Neurological: Positive for headaches.       Allergies  Review of patient's allergies indicates no known allergies.  Home Medications   Prior to Admission medications   Medication Sig Start Date End Date Taking? Authorizing Provider  Acetaminophen (CHLORASEPTIC SORE THROAT PO) Take 1-2 sprays by mouth every 4 (four) hours as needed (sore throat).    Historical Provider, MD  acetaminophen (TYLENOL) 500 MG tablet Take 500 mg by mouth every 6 (six) hours as needed.    Historical Provider, MD  cyclobenzaprine (FLEXERIL) 10 MG tablet Take 1 tablet (10 mg total) by mouth at bedtime as needed for muscle spasms. 10/25/14   Rodolph Bong, MD  guaiFENesin (MUCINEX) 600 MG 12 hr tablet Take 600 mg by mouth 2 (two) times daily.    Historical Provider, MD  meloxicam (MOBIC) 7.5 MG tablet Take 1 tablet (7.5 mg total) by mouth daily. 12/26/14   Tiffany Neva Seat, PA-C  methocarbamol (ROBAXIN) 500 MG tablet Take 1 tablet (500 mg total) by mouth 2 (two) times daily. 12/26/14   Tiffany Neva Seat, PA-C  naproxen (NAPROSYN) 500 MG tablet Take 1 tablet (500 mg total) by mouth 2 (two) times daily. 10/25/14   Rodolph Bong, MD  ondansetron (ZOFRAN) 4 MG tablet Take 1 tablet (4 mg total) by mouth every 6 (six) hours as needed for nausea or vomiting. 05/29/13   Pricilla Loveless, MD  Phenyleph-Doxylamine-DM-APAP (TYLENOL COLD MULTI-SYMPTOM) 5-6.25-10-325 MG/15ML LIQD Take 15 mLs by mouth every 12 (twelve) hours as needed (cold).    Historical Provider, MD  traMADol (ULTRAM) 50 MG  tablet Take 1 tablet (50 mg total) by mouth every 6 (six) hours as needed. 12/26/14   Tiffany Neva Seat, PA-C   BP 117/75 mmHg  Pulse 105  Temp(Src) 100.6 F (38.1 C) (Oral)  Resp 18  SpO2 96% Physical Exam  Constitutional: He is oriented to person, place, and time. He appears well-developed and well-nourished.  HENT:  Head: Normocephalic and atraumatic.  Ears: TM's normal Nose: Rhinorrhea Throat: uvula midline, no tonsillar exudates   Neck:  Cervical lymphadenopathy  Cardiovascular:  Normal rate, regular rhythm and normal heart sounds.   Pulmonary/Chest: Effort normal and breath sounds normal.  Abdominal: Soft. He exhibits no distension. There is no tenderness.  Soft, nontender  Lymphadenopathy:    He has cervical adenopathy.  Neurological: He is alert and oriented to person, place, and time.  Skin: Skin is warm and dry.  Psychiatric: He has a normal mood and affect.  Nursing note and vitals reviewed.   ED Course  Procedures (including critical care time)  DIAGNOSTIC STUDIES: Oxygen Saturation is 96% on room air, normal by my interpretation.    COORDINATION OF CARE: 8:34 PM Discussed treatment plan with pt at bedside and pt agreed to plan.   Labs Review Labs Reviewed  CBC  BASIC METABOLIC PANEL     MDM   Final diagnoses:  Flu-like symptoms    I personally performed the services described in this documentation, which was scribed in my presence. The recorded information has been reviewed and is accurate.     8:42 PM Pt with flu like sxs.  Since it's within 48 hrs pt prefers tamiflu.  Will provide sxs treatment.  Otherwise, labs are reassuring, low grade temp.  Tylenol given for fever.     Fayrene Helper, PA-C 06/08/15 2044  Glynn Octave, MD 06/09/15 [phone]

## 2015-06-08 NOTE — Discharge Instructions (Signed)
Viral Infections °A viral infection can be caused by different types of viruses. Most viral infections are not serious and resolve on their own. However, some infections may cause severe symptoms and may lead to further complications. °SYMPTOMS °Viruses can frequently cause: °· Minor sore throat. °· Aches and pains. °· Headaches. °· Runny nose. °· Different types of rashes. °· Watery eyes. °· Tiredness. °· Cough. °· Loss of appetite. °· Gastrointestinal infections, resulting in nausea, vomiting, and diarrhea. °These symptoms do not respond to antibiotics because the infection is not caused by bacteria. However, you might catch a bacterial infection following the viral infection. This is sometimes called a "superinfection." Symptoms of such a bacterial infection may include: °· Worsening sore throat with pus and difficulty swallowing. °· Swollen neck glands. °· Chills and a high or persistent fever. °· Severe headache. °· Tenderness over the sinuses. °· Persistent overall ill feeling (malaise), muscle aches, and tiredness (fatigue). °· Persistent cough. °· Yellow, green, or brown mucus production with coughing. °HOME CARE INSTRUCTIONS  °· Only take over-the-counter or prescription medicines for pain, discomfort, diarrhea, or fever as directed by your caregiver. °· Drink enough water and fluids to keep your urine clear or pale yellow. Sports drinks can provide valuable electrolytes, sugars, and hydration. °· Get plenty of rest and maintain proper nutrition. Soups and broths with crackers or rice are fine. °SEEK IMMEDIATE MEDICAL CARE IF:  °· You have severe headaches, shortness of breath, chest pain, neck pain, or an unusual rash. °· You have uncontrolled vomiting, diarrhea, or you are unable to keep down fluids. °· You or your child has an oral temperature above 102° F (38.9° C), not controlled by medicine. °· Your baby is older than 3 months with a rectal temperature of 102° F (38.9° C) or higher. °· Your baby is 3  months old or younger with a rectal temperature of 100.4° F (38° C) or higher. °MAKE SURE YOU:  °· Understand these instructions. °· Will watch your condition. °· Will get help right away if you are not doing well or get worse. °  °This information is not intended to replace advice given to you by your health care provider. Make sure you discuss any questions you have with your health care provider. °  °Document Released: 01/24/2005 Document Revised: 07/09/2011 Document Reviewed: 09/22/2014 °Elsevier Interactive Patient Education ©2016 [employer]vier Inc. ° °

## 2015-06-08 NOTE — ED Notes (Signed)
Pt reports flu like symptoms x 2 days with n/v/d, bodyaches, sore throat, headache, fever. Mask on pt at triage.

## 2016-01-27 ENCOUNTER — Ambulatory Visit: Payer: BLUE CROSS/BLUE SHIELD | Attending: Family Medicine | Admitting: Physical Therapy

## 2016-01-27 DIAGNOSIS — M6281 Muscle weakness (generalized): Secondary | ICD-10-CM | POA: Diagnosis present

## 2016-01-27 DIAGNOSIS — M25551 Pain in right hip: Secondary | ICD-10-CM | POA: Insufficient documentation

## 2016-01-27 DIAGNOSIS — M25552 Pain in left hip: Secondary | ICD-10-CM | POA: Insufficient documentation

## 2016-01-27 NOTE — Patient Instructions (Signed)
    HIP: Flexors - Supine   Lie on edge of surface. Place leg off the surface, allow knee to bend. Bring other knee toward chest. Hold ___ seconds. ___ reps per set, ___ sets per day, ___ days per week Rest lowered foot on stool. Abduction: Clam (Eccentric) - Side-Lying   Lie on side with knees bent. Lift top knee, keeping feet together. Keep trunk steady. Slowly lower for 3-5 seconds. _10__ reps per set, _1__ sets per day, __7_ days  Copyright  VHI. All rights reserved.   Lavinia SharpsStacy Reeanna Acri PT [facility] [address], [address] [place], [place] [address] Phone # [phone] Fax [phone]

## 2016-01-27 NOTE — Therapy (Addendum)
[facility] [address], [place], [place], [address] Phone: [phone]   Fax:  [phone]  Physical Therapy Evaluation  Patient Details  Name: Chris Blankenship MRN: [id number] Date of Birth: [birth year] Referring Provider: Dr. Orland Mustard  Encounter Date: 01/27/2016      PT End of Session - 01/27/16 1243    Visit Number 1   Number of Visits 60   Date for PT Re-Evaluation 03/09/16   Authorization Type BCBS   PT Start Time 0840   PT Stop Time 0927   PT Time Calculation (min) 47 min   Activity Tolerance Patient tolerated treatment well      No past medical history on file.  Past Surgical History:  Procedure Laterality Date  . APPENDECTOMY      There were no vitals filed for this visit.       Subjective Assessment - 01/27/16 0844    Subjective Started 2008 left hip and radiates down leg but can be right hip as well.  Will go away for 2-3 weeks then return for 2-3 days.  Some history of back pain related to work.  Pain with getting in/out of the bed.    Limitations House hold activities   How long can you sit comfortably? as long as wanted   How long can you walk comfortably? comfortably no where   Diagnostic tests no x-rays or MRI   Currently in Pain? Yes   Pain Score 0-No pain   Pain Location Hip   Pain Orientation Left;Right   Pain Type Chronic pain   Pain Onset More than a month ago   Pain Frequency Intermittent   Aggravating Factors  AM; rising after sitting a long time; getting out of bed   Pain Relieving Factors Sherri Sear            [facility] PT Assessment - 01/27/16 0001      Assessment   Medical Diagnosis left hip pain   Referring Provider Dr. Orland Mustard   Onset Date/Surgical Date --  2008   Next MD Visit next year for check up   Prior Therapy none     Precautions   Precautions None     Restrictions   Weight Bearing Restrictions No     Balance Screen   Has the patient fallen in the past 6  months No   Has the patient had a decrease in activity level because of a fear of falling?  No   Is the patient reluctant to leave their home because of a fear of falling?  No     Home Ecologist residence   Living Arrangements Spouse/significant other   Type of [place] One level     Prior Function   Level of Independence Independent   Vocation Full time employment   Vocation Requirements grounds production   Leisure drive/travel     Observation/Other Assessments   Focus on Therapeutic Outcomes (FOTO)  55% limitation     AROM   AROM Assessment Site Hip;Lumbar   Lumbar Flexion WFLs   Lumbar Extension WFLs   Lumbar - Right Side Bend WFLs   Lumbar - Left Side Bend WFLs     Strength   Strength Assessment Site Hip;Lumbar   Right/Left Hip Right;Left   Right Hip Extension 4-/5   Right Hip ABduction 4-/5   Left Hip Extension 4-/5   Left Hip ABduction 4-/5   Lumbar Flexion 4+/5  Lumbar Extension 4+/5     Flexibility   Soft Tissue Assessment /Muscle Length yes   Hamstrings decreased 55 degrees bilaterally   Quadriceps decreased left > right hip flexor length     Palpation   Palpation comment No tenderness lumbar muscles, hip, buttock     Special Tests   Lumbar Tests FABER test;Slump Test;Prone Knee Bend Test;Straight Leg Raise   Hip Special Tests  Thomas Test;Hip Scouring     FABER test   findings Negative     Slump test   Findings Negative     Prone Knee Bend Test   Findings Negative     Straight Leg Raise   Findings Negative     Thomas Test    Findings Positive   Side Left     Hip Scouring   Findings Negative                             PT Short Term Goals - 01/27/16 1252      PT SHORT TERM GOAL #1   Title The patient will demonstrate home compliance with self care using heat/ice and initial ex's for pain relief  02/17/16   Time 3   Period Weeks   Status New     PT SHORT TERM  GOAL #2   Title The patient will report a 25% reduction in pain with getting out of bed and rising from a chair   Time 3   Period Weeks   Status New           PT Long Term Goals - 01/27/16 1253      PT LONG TERM GOAL #1   Title The patient will be independent in self treatment/care methods and safe,self progression of HEP   03/09/16   Time 6   Period Weeks   Status New     PT LONG TERM GOAL #2   Title The patient will have 4/5 strength in hip abd and hip extension bilaterally needed for standing long periods of time at work   Time 6   Period Weeks   Status New     PT LONG TERM GOAL #3   Title The patient will report a 50% reduction in bilateral hip pain with getting out of bed and rising from a chair   Time 6   Period Weeks   Status New     PT LONG TERM GOAL #4   Title FOTO functional outcome score improved from 55% limitation to 36% indicating improved function with less pain   Time 6   Period Weeks   Status New               Plan - 01/27/16 1244    Clinical Impression Statement The patient is a 30 year old male with complaints of bilateral lateral hip pain and radiating pain to his lower leg.  Pain has been present since 2008 with no apparent injury.  Symptoms follow no clear pattern and may go away for 2-3 weeks, then pain may return for 2-3 days with pain getting out of bed or rising from a chair.  Symptoms may be right or left hip but never at the same time.  No pain today.  No production of pain with lumbar repeated movements.  Hip AROM and PROM WFLs.  Decreased HS and hip flexor lengths.   No tenderness in gluteal or piriformis muscles or bursa today.  Decreased hip abduction  and extension strength.  Good core strength.  The patient is of low complexity evaluation with good social support and no co-morbidities.     Rehab Potential Good   PT Frequency 2x / week   PT Duration 6 weeks   PT Treatment/Interventions ADLs/Self Care Home  Management;Cryotherapy;Electrical Stimulation;Iontophoresis '4mg'$ /ml Dexamethasone;Ultrasound;Moist Heat;Therapeutic exercise;Patient/family education;Manual techniques;Taping;Dry needling   PT Next Visit Plan trial of PT for hip strengthening and flexibility;  modalities for pain control (patient wants to get off [employer]) home TENS?     PT Home Exercise Plan clams, hip flexor stretch      Patient will benefit from skilled therapeutic intervention in order to improve the following deficits and impairments:  Pain, Impaired flexibility, Decreased strength  Visit Diagnosis: Pain in left hip - Plan: PT plan of care cert/re-cert  Pain in right hip - Plan: PT plan of care cert/re-cert  Muscle weakness (generalized) - Plan: PT plan of care cert/re-cert     Problem List There are no active problems to display for this patient.  Ruben Im, PT 01/27/16 1:00 PM Phone: [phone] Fax: [phone]  Alvera Singh 01/27/2016, 12:59 PM PHYSICAL THERAPY DISCHARGE SUMMARY  Visits from Start of Care: 1  Current functional level related to goals / functional outcomes: See above.  Pt had 3 no-show appointments after evaluation and was not able to be contacted.  Pt will be discharged.   Remaining deficits: See above for current status.     Education / Equipment: HEP Plan: Patient agrees to discharge.  Patient goals were not met. Patient is being discharged due to not returning since the last visit.  ?????        Sigurd Sos, PT 02/08/16 8:59 AM  [facility] [address] [place], [place], [address] Phone: [phone]   Fax:  [phone]  Name: Jermy Couper MRN: [id number] Date of Birth: 1985-05-22

## 2016-02-01 ENCOUNTER — Ambulatory Visit: Payer: BLUE CROSS/BLUE SHIELD | Attending: Family Medicine | Admitting: Physical Therapy

## 2016-02-06 ENCOUNTER — Ambulatory Visit: Payer: BLUE CROSS/BLUE SHIELD | Admitting: Physical Therapy

## 2016-02-06 ENCOUNTER — Telehealth: Payer: Self-pay | Admitting: Physical Therapy

## 2016-02-06 NOTE — Telephone Encounter (Signed)
Therapist attempted to contact patient but no answer and unable to leave a voicemail.  Dessa PhiKatherine Matthews, PTA 02/06/16 8:59 AM

## 2016-02-08 ENCOUNTER — Ambulatory Visit: Payer: BLUE CROSS/BLUE SHIELD

## 2016-02-10 ENCOUNTER — Ambulatory Visit
Admission: RE | Admit: 2016-02-10 | Discharge: 2016-02-10 | Disposition: A | Discharge status: Home / Self Care | Payer: BLUE CROSS/BLUE SHIELD | Source: Ambulatory Visit | Attending: Family Medicine | Admitting: Family Medicine

## 2016-02-10 ENCOUNTER — Other Ambulatory Visit: Payer: Self-pay | Admitting: Family Medicine

## 2016-02-10 DIAGNOSIS — M25552 Pain in left hip: Principal | ICD-10-CM

## 2016-02-10 DIAGNOSIS — M25551 Pain in right hip: Secondary | ICD-10-CM

## 2016-02-13 ENCOUNTER — Encounter: Payer: Self-pay | Admitting: Physical Therapy

## 2016-02-15 ENCOUNTER — Encounter: Payer: Self-pay | Admitting: Physical Therapy

## 2016-02-22 ENCOUNTER — Encounter: Payer: Self-pay | Admitting: Physical Therapy

## 2016-02-27 ENCOUNTER — Encounter: Payer: Self-pay | Admitting: Physical Therapy

## 2016-12-29 IMAGING — CR DG HIP (WITH OR WITHOUT PELVIS) 2-3V*L*
2 series · 2 of 2 positions shown · non-contrast
Comparison: None.

CLINICAL DATA: Intermittent hip pain for years

EXAM:
DG HIP (WITH OR WITHOUT PELVIS) 2-3V LEFT

[w hip ap left]
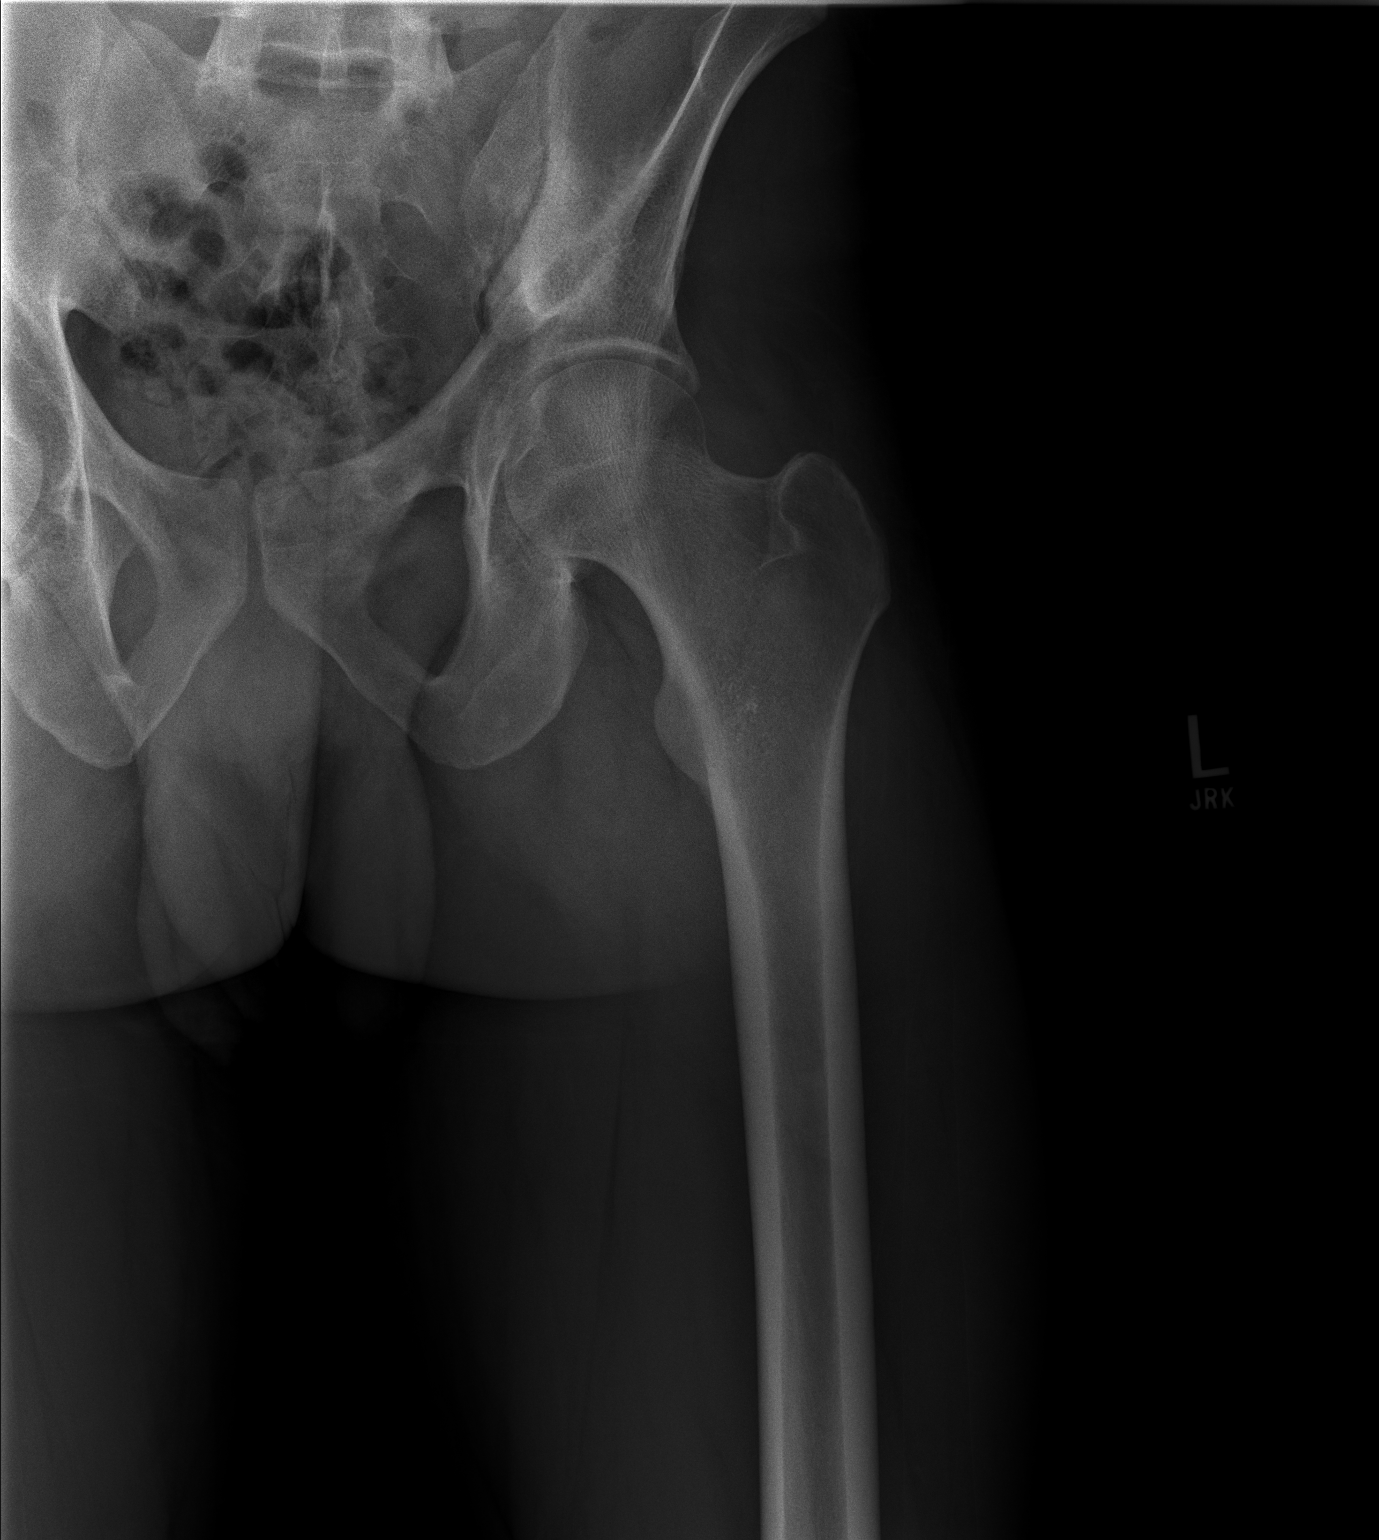

[w hip lat left]
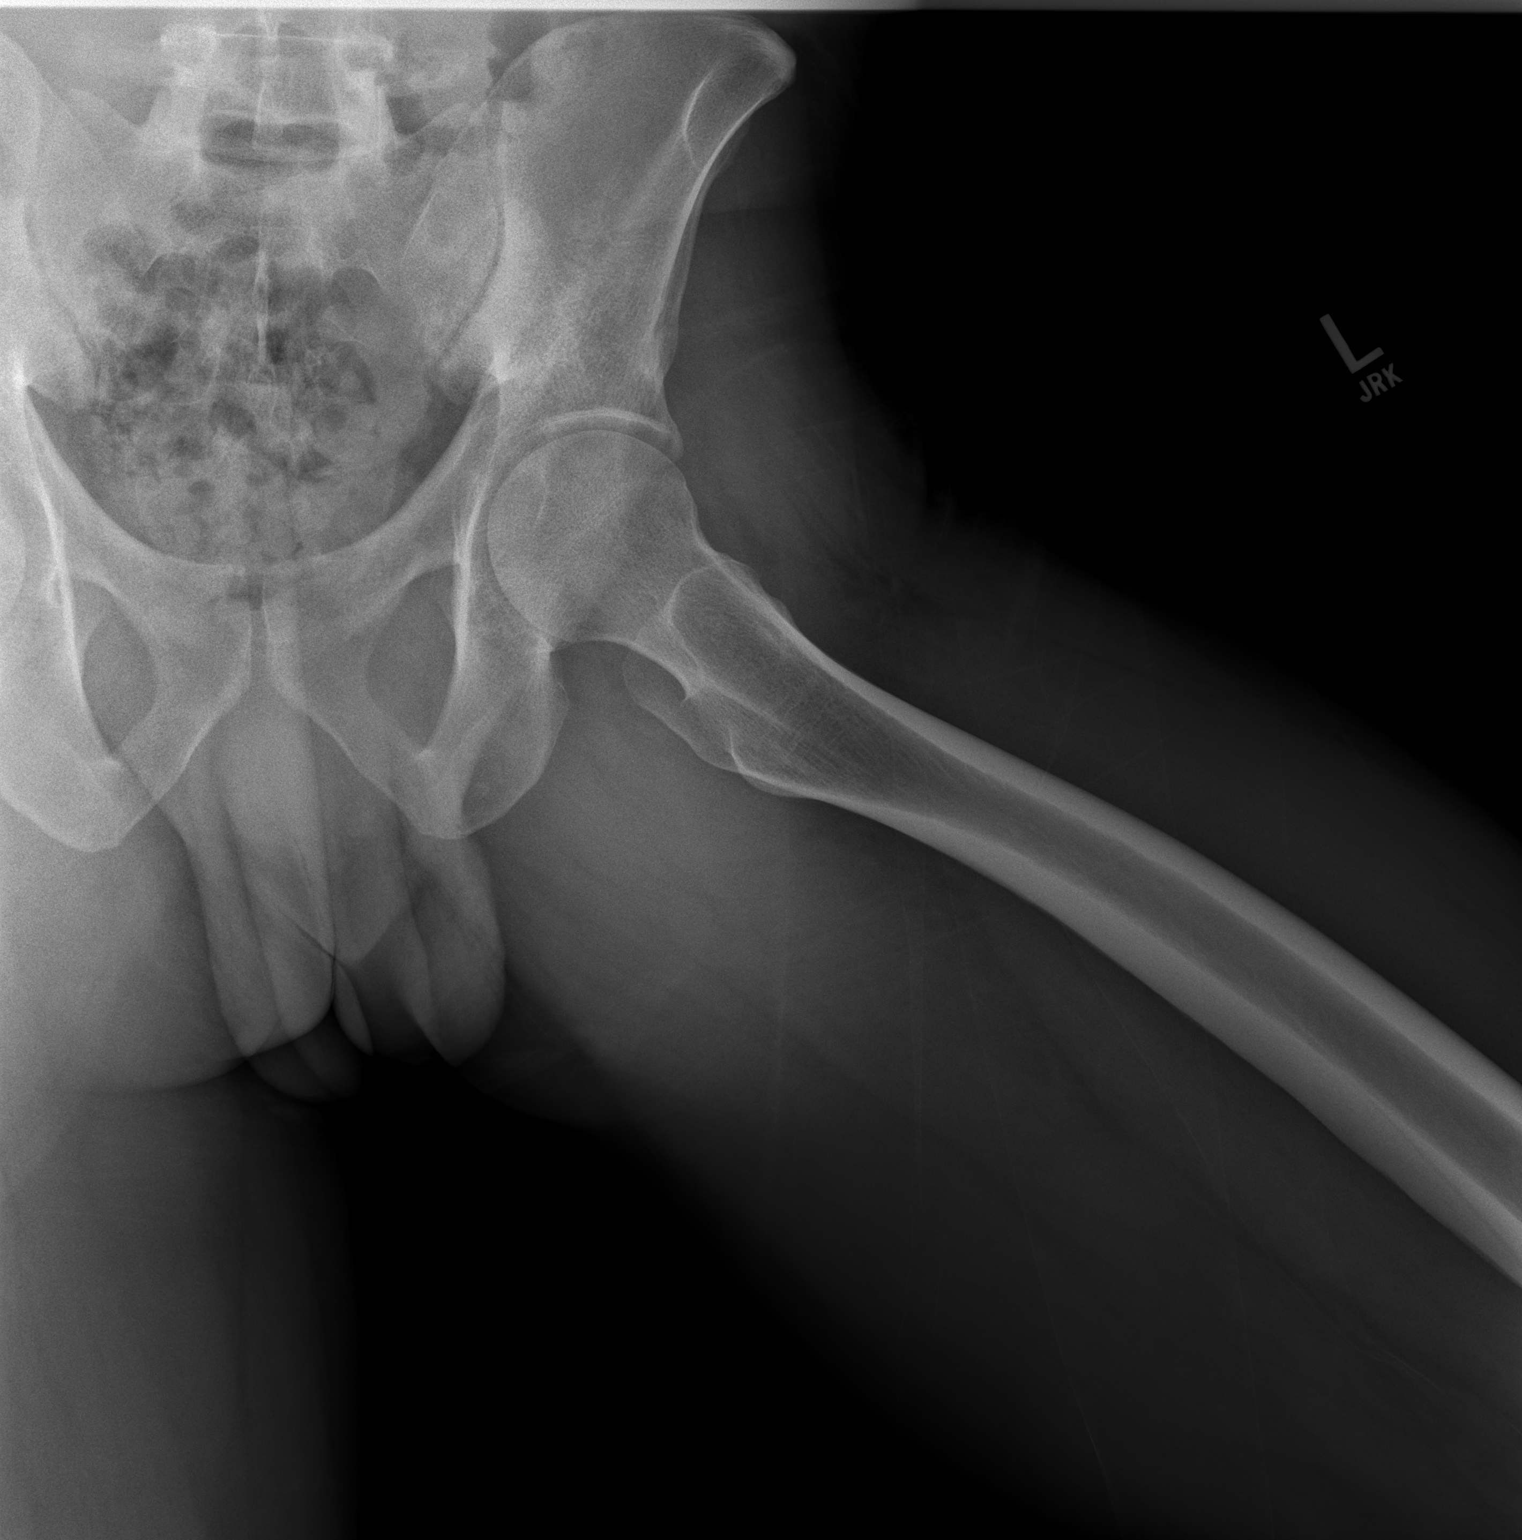

[2 of 2 positions shown; findings below may reference images not displayed]

FINDINGS: Two views of the left hip submitted. No acute fracture or
subluxation. No radiopaque foreign body.
IMPRESSION: Negative.

## 2016-12-29 IMAGING — CR DG HIP (WITH OR WITHOUT PELVIS) 2-3V*R*
2 series · 2 of 2 positions shown · non-contrast
Comparison: None.

CLINICAL DATA: Intermittent pain

EXAM:
DG HIP   2-3V RIGHT

[w hip ap right]
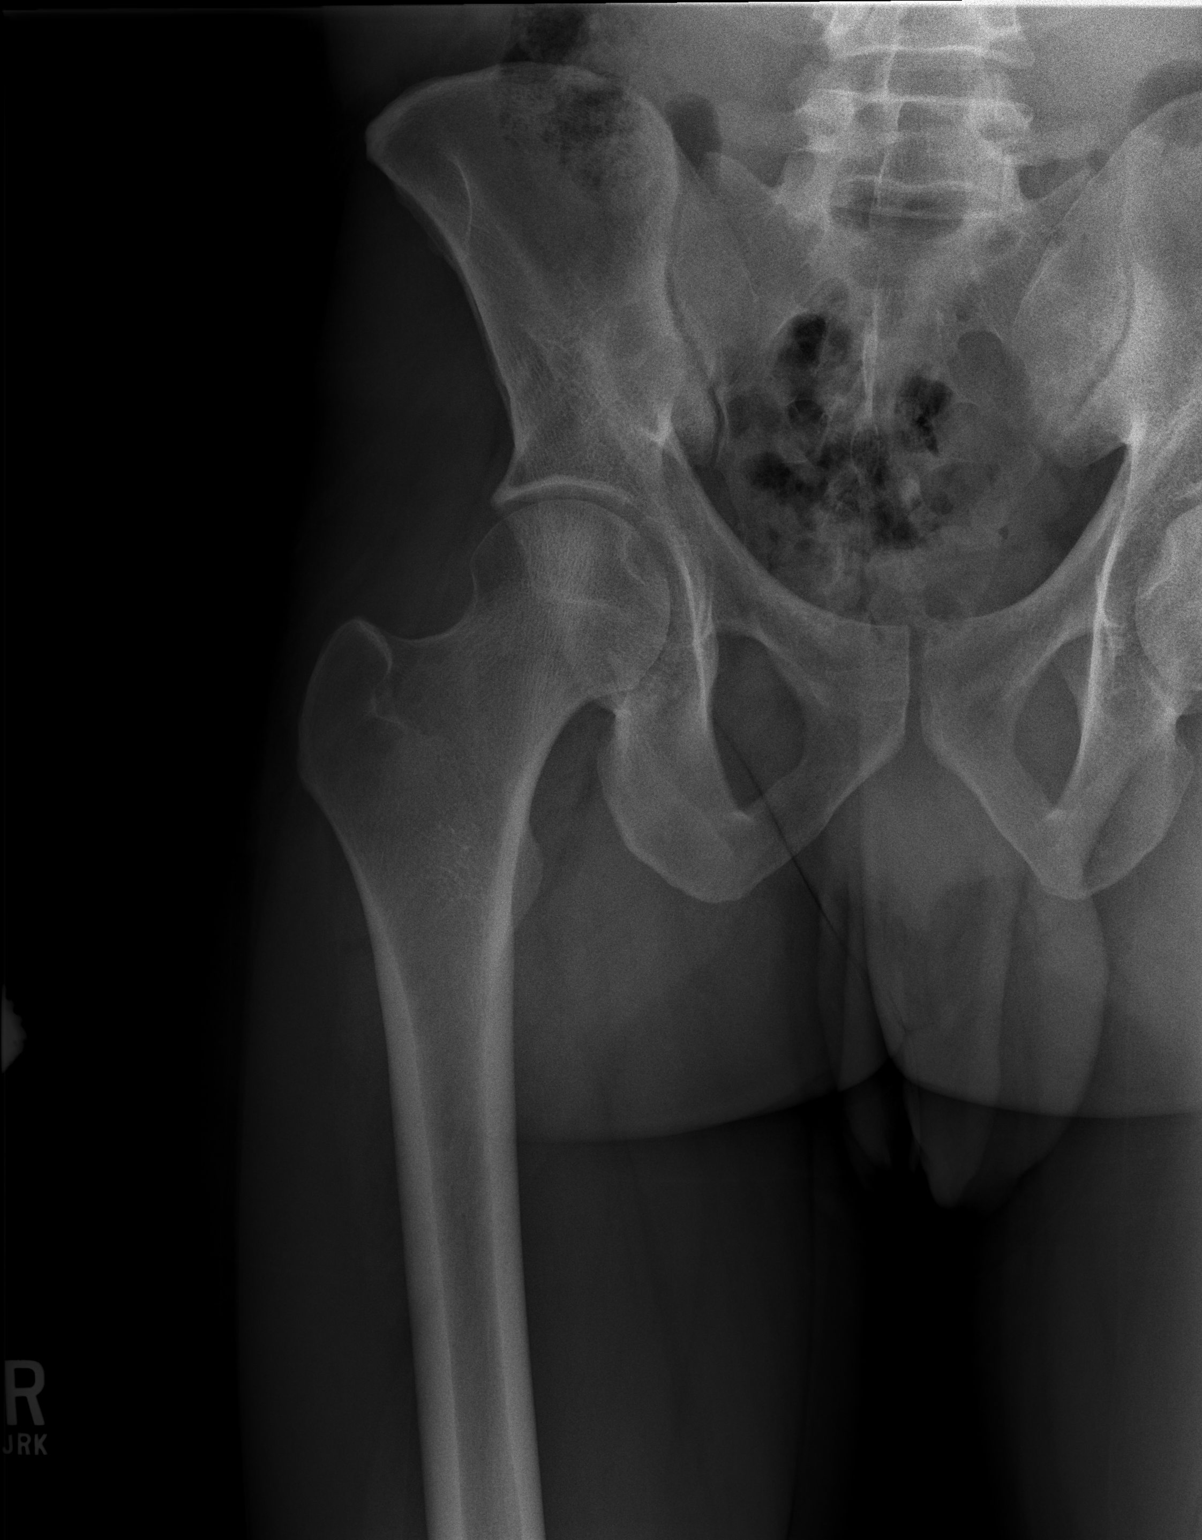

[w hip frog right]
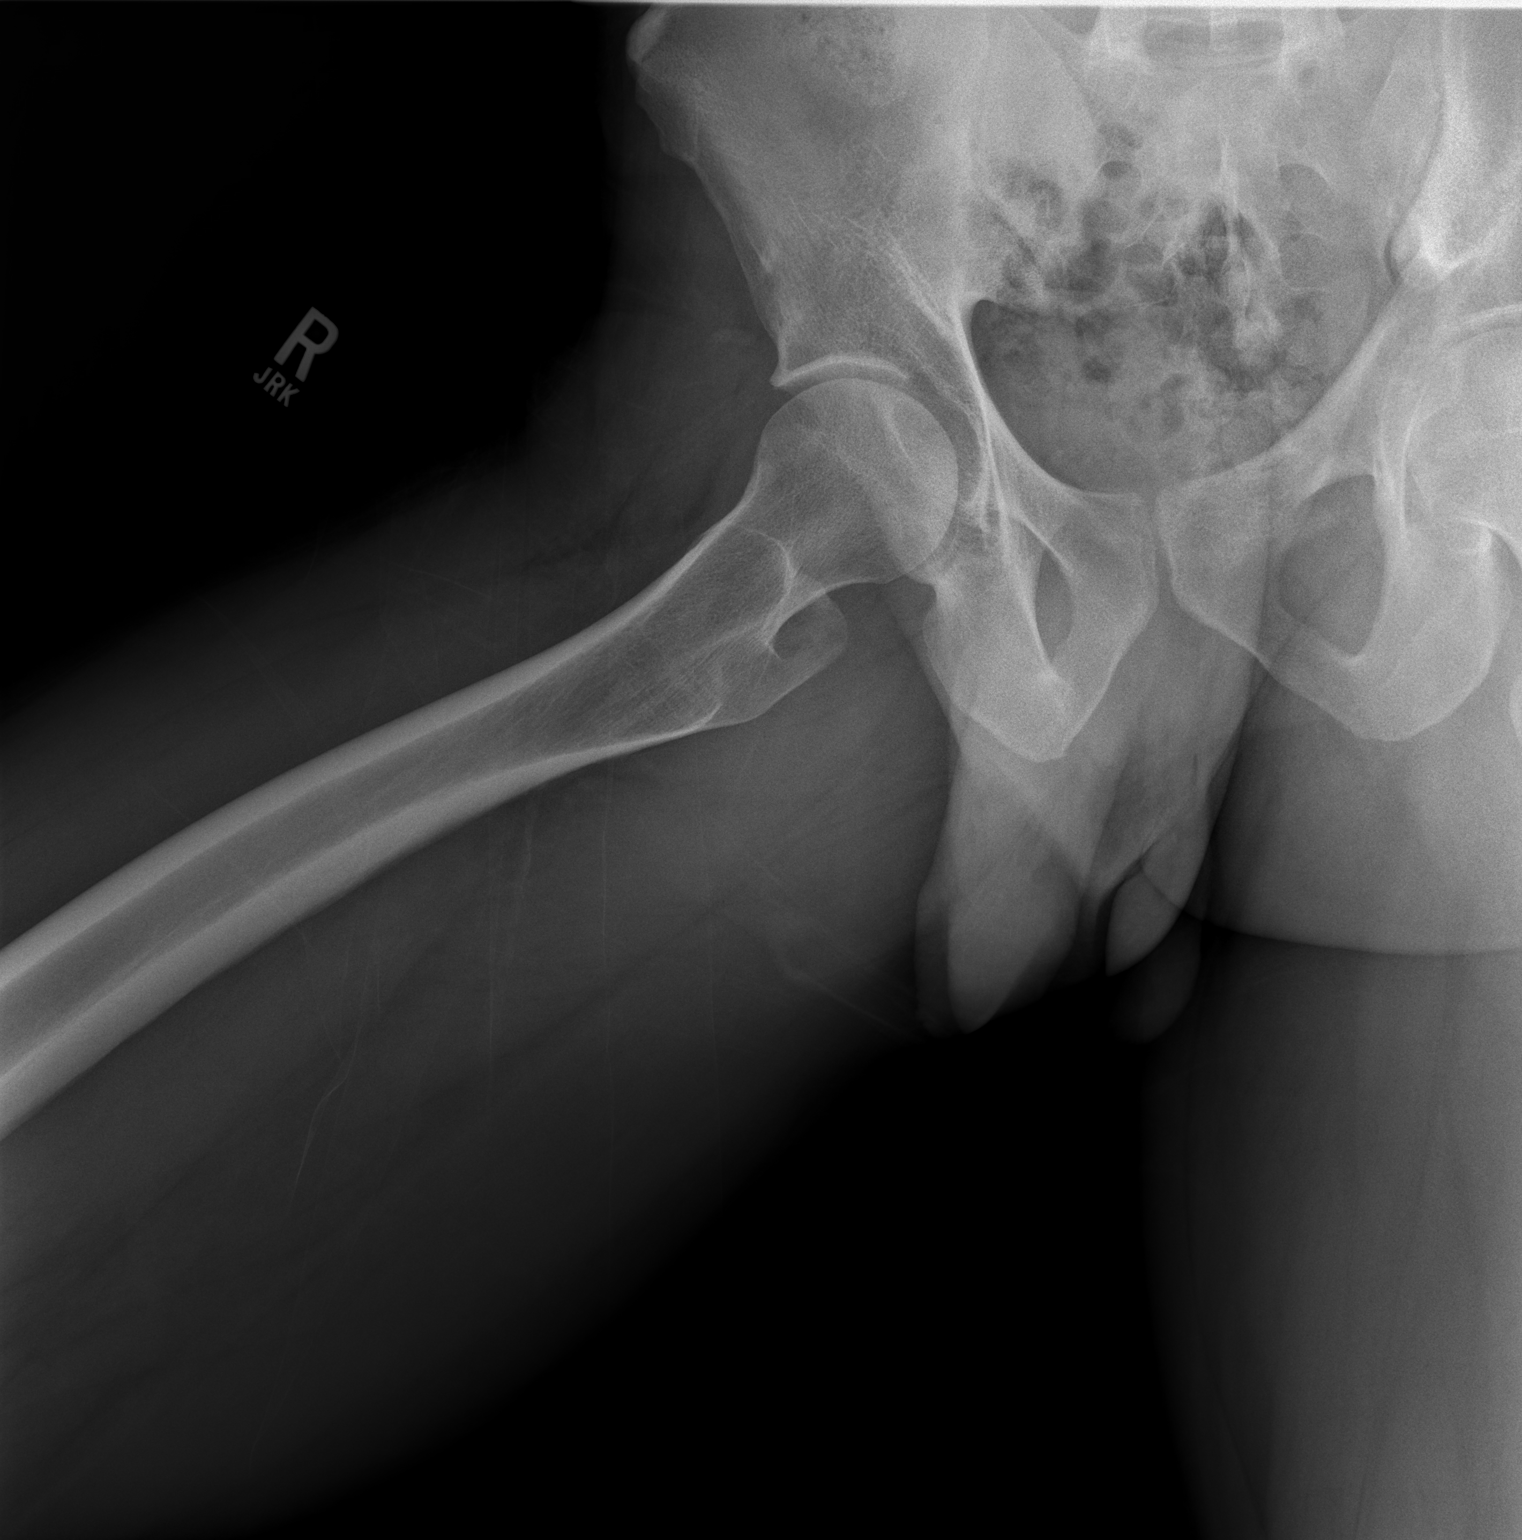

[2 of 2 positions shown; findings below may reference images not displayed]

FINDINGS: Frontal and lateral views obtained. No fracture or dislocation.
Joint spaces appear normal. No erosive change.
IMPRESSION: No fracture or dislocation.  No evident arthropathy.

## 2021-05-26 ENCOUNTER — Other Ambulatory Visit: Payer: Self-pay

## 2021-05-26 ENCOUNTER — Ambulatory Visit
Admission: EM | Admit: 2021-05-26 | Discharge: 2021-05-26 | Disposition: A | Discharge status: Home / Self Care | Payer: BLUE CROSS/BLUE SHIELD | Attending: Emergency Medicine | Admitting: Emergency Medicine

## 2021-05-26 DIAGNOSIS — Z113 Encounter for screening for infections with a predominantly sexual mode of transmission: Secondary | ICD-10-CM | POA: Insufficient documentation

## 2021-05-26 DIAGNOSIS — Z9189 Other specified personal risk factors, not elsewhere classified: Secondary | ICD-10-CM | POA: Insufficient documentation

## 2021-05-26 NOTE — Discharge Instructions (Addendum)
The results of your STD testing today will be made available to you once received.  They will initially be posted to your MyChart and, if any of your results are abnormal, you will receive a phone call with those results along with further instructions regarding treatment.  °  °Thank you for visiting urgent care today.  I appreciate the opportunity to participate in your care. °

## 2021-05-26 NOTE — ED Triage Notes (Signed)
Pt requests to be tested for STDs he denies having any symptoms at this time.

## 2021-05-26 NOTE — ED Provider Notes (Signed)
[facility]    CSN: [id number] Arrival date & time: 05/26/21  9983    HISTORY   Chief Complaint  Patient presents with   SEXUALLY TRANSMITTED DISEASE   HPI Chris Blankenship is a 36 y.o. male. Patient presents for STD screening.  Patient denies fever, aches, chills, penile discharge, burning with urination, genital lesions, scrotal swelling, scrotal pain, testicular pain, obstructive urinary outflow tract symptoms.  She reports to episodes of unprotected sexual intercourse with 2 different partners in the past 2 weeks.  Patient states he would like to be evaluated.  The history is provided by the patient.  History reviewed. No pertinent past medical history. There are no problems to display for this patient.  Past Surgical History:  Procedure Laterality Date   APPENDECTOMY      Home Medications    Prior to Admission medications   Not on File   Family History History reviewed. No pertinent family history. Social History Social History   Tobacco Use   Smoking status: Every Day    Packs/day: 1.00    Types: Cigarettes  Substance Use Topics   Alcohol use: Yes   Drug use: Yes    Types: Marijuana   Allergies   Patient has no known allergies.  Review of Systems Review of Systems Pertinent findings noted in history of present illness.   Physical Exam Triage Vital Signs ED Triage Vitals  Enc Vitals Group     BP 02/24/21 0827 (!) 147/82     Pulse Rate 02/24/21 0827 72     Resp 02/24/21 0827 18     Temp 02/24/21 0827 98.3 F (36.8 C)     Temp Source 02/24/21 0827 Oral     SpO2 02/24/21 0827 98 %     Weight --      Height --      Head Circumference --      Peak Flow --      Pain Score 02/24/21 0826 5     Pain Loc --      Pain Edu? --      Excl. in GC? --   No data found.  Updated Vital Signs BP 130/81 (BP Location: Right Arm)    Pulse 71    Temp 97.9 F (36.6 C) (Oral)    Resp 20    SpO2 96%   Physical Exam Vitals and nursing note reviewed.   Constitutional:      General: He is not in acute distress.    Appearance: Normal appearance. He is not ill-appearing.  HENT:     Head: Normocephalic and atraumatic.  Eyes:     General: Lids are normal.        Right eye: No discharge.        Left eye: No discharge.     Extraocular Movements: Extraocular movements intact.     Conjunctiva/sclera: Conjunctivae normal.     Right eye: Right conjunctiva is not injected.     Left eye: Left conjunctiva is not injected.  Neck:     Trachea: Trachea and phonation normal.  Cardiovascular:     Rate and Rhythm: Normal rate and regular rhythm.     Pulses: Normal pulses.     Heart sounds: Normal heart sounds. No murmur heard.   No friction rub. No gallop.  Pulmonary:     Effort: Pulmonary effort is normal. No accessory muscle usage, prolonged expiration or respiratory distress.     Breath sounds: Normal breath sounds. No stridor, decreased air  movement or transmitted upper airway sounds. No decreased breath sounds, wheezing, rhonchi or rales.  Chest:     Chest wall: No tenderness.  Genitourinary:    Comments: Pt politely declines GU exam, pt did provide a penile swab for testing.   Musculoskeletal:        General: Normal range of motion.     Cervical back: Normal range of motion and neck supple. Normal range of motion.  Lymphadenopathy:     Cervical: No cervical adenopathy.  Skin:    General: Skin is warm and dry.     Findings: No erythema or rash.  Neurological:     General: No focal deficit present.     Mental Status: He is alert and oriented to person, place, and time.  Psychiatric:        Mood and Affect: Mood normal.        Behavior: Behavior normal.    Visual Acuity Right Eye Distance:   Left Eye Distance:   Bilateral Distance:    Right Eye Near:   Left Eye Near:    Bilateral Near:     UC Couse / Diagnostics / Procedures:    EKG  Radiology No results found.  Procedures Procedures (including critical care  time)  UC Diagnoses / Final Clinical Impressions(s)   I have reviewed the triage vital signs and the nursing notes.  Pertinent labs & imaging results that were available during my care of the patient were reviewed by me and considered in my medical decision making (see chart for details).    Final diagnoses:  Screening examination for STD (sexually transmitted disease)  At risk for sexually transmitted disease due to unprotected sex   STD screening was performed, patient advised that the results be posted to their MyChart and if any of the results are positive, they will be notified by phone, further treatment will be provided as indicated based on results of STD screening. Return precautions advised.  Drug allergies reviewed, all questions addressed.    ED Prescriptions   None    PDMP not reviewed this encounter.  Pending results:  Labs Reviewed  RPR  HIV ANTIBODY (ROUTINE TESTING W REFLEX)  CYTOLOGY, (ORAL, ANAL, URETHRAL) ANCILLARY ONLY    Medications Ordered in UC: Medications - No data to display  Disposition Upon Discharge:  Condition: stable for discharge home  Patient presented with concern for an acute illness with associated systemic symptoms and significant discomfort requiring urgent management. In my opinion, this is a condition that a prudent lay person (someone who possesses an average knowledge of health and medicine) may potentially expect to result in complications if not addressed urgently such as respiratory distress, impairment of bodily function or dysfunction of bodily organs.   As such, the patient has been evaluated and assessed, work-up was performed and treatment was provided in alignment with urgent care protocols and evidence based medicine.  Patient/parent/caregiver has been advised that the patient may require follow up for further testing and/or treatment if the symptoms continue in spite of treatment, as clinically indicated and  appropriate.  Routine symptom specific, illness specific and/or disease specific instructions were discussed with the patient and/or caregiver at length.  Prevention strategies for avoiding STD exposure were also discussed.  The patient will follow up with their current PCP if and as advised. If the patient does not currently have a PCP we will assist them in obtaining one.   The patient may need specialty follow up if the symptoms continue,  in spite of conservative treatment and management, for further workup, evaluation, consultation and treatment as clinically indicated and appropriate.  Patient/parent/caregiver verbalized understanding and agreement of plan as discussed.  All questions were addressed during visit.  Please see discharge instructions below for further details of plan.  Discharge Instructions:   Discharge Instructions      The results of your STD testing today will be made available to you once received.  They will initially be posted to your MyChart and, if any of your results are abnormal, you will receive a phone call with those results along with further instructions regarding treatment.    Thank you for visiting urgent care today.  I appreciate the opportunity to participate in your care.     This office note has been dictated using Teaching laboratory technician.  Unfortunately, and despite my best efforts, this method of dictation can sometimes lead to occasional typographical or grammatical errors.  I apologize in advance if this occurs.      Theadora Rama Scales, PA-C 05/26/21 1138

## 2021-05-27 LAB — RPR: RPR Ser Ql: NONREACTIVE

## 2021-05-27 LAB — HIV ANTIBODY (ROUTINE TESTING W REFLEX): HIV Screen 4th Generation wRfx: NONREACTIVE

## 2021-05-29 ENCOUNTER — Telehealth (HOSPITAL_COMMUNITY): Payer: Self-pay | Admitting: Emergency Medicine

## 2021-05-29 LAB — CYTOLOGY, (ORAL, ANAL, URETHRAL) ANCILLARY ONLY
Chlamydia: POSITIVE — AB
Comment: NEGATIVE
Comment: NEGATIVE
Comment: NORMAL
Neisseria Gonorrhea: NEGATIVE
Trichomonas: NEGATIVE

## 2021-05-29 MED ORDER — DOXYCYCLINE HYCLATE 100 MG PO CAPS: 100.0000 mg | ORAL_CAPSULE | Freq: Two times a day (BID) | ORAL | 0 refills | Status: AC

## 2022-09-20 ENCOUNTER — Ambulatory Visit
Admission: RE | Admit: 2022-09-20 | Discharge: 2022-09-20 | Disposition: A | Discharge status: Home / Self Care | Payer: Self-pay | Source: Ambulatory Visit | Attending: Internal Medicine | Admitting: Internal Medicine

## 2022-09-20 VITALS — BP 123/81 | HR 62 | Temp 98.0°F | Resp 17

## 2022-09-20 DIAGNOSIS — Z113 Encounter for screening for infections with a predominantly sexual mode of transmission: Secondary | ICD-10-CM

## 2022-09-20 NOTE — ED Triage Notes (Addendum)
Pt c/o need STD testing pt states he may have been exposed to and STD, burning with urination, x 1 week. Pt would like blood work as well.

## 2022-09-20 NOTE — Discharge Instructions (Signed)
Your STD testing is pending.  Will call if anything is positive and treat if necessary.  We ask that you refrain from sexual activity until test results and treatment are complete. 

## 2022-09-20 NOTE — ED Provider Notes (Signed)
[facility]    CSN: [id number] Arrival date & time: 09/20/22  1144      History   Chief Complaint Chief Complaint  Patient presents with   SEXUALLY TRANSMITTED DISEASE    Entered by patient    HPI Chris Blankenship is a 37 y.o. male.   Patient presents today for STD testing.  He reports that he has been having a little bit of discomfort when urinating that started a few days ago.  Denies penile discharge, testicular pain, abdominal pain, fever.  Denies any confirmed exposure to STD but has had unprotected intercourse.     History reviewed. No pertinent past medical history.  There are no problems to display for this patient.   Past Surgical History:  Procedure Laterality Date   APPENDECTOMY         Home Medications    Prior to Admission medications   Not on File    Family History History reviewed. No pertinent family history.  Social History Social History   Tobacco Use   Smoking status: Every Day    Packs/day: 1    Types: Cigarettes  Substance Use Topics   Alcohol use: Yes   Drug use: Yes    Types: Marijuana     Allergies   Patient has no known allergies.   Review of Systems Review of Systems Per HPI  Physical Exam Triage Vital Signs ED Triage Vitals [09/20/22 1207]  Enc Vitals Group     BP 123/81     Pulse Rate 62     Resp 17     Temp 98 F (36.7 C)     Temp Source Oral     SpO2 98 %     Weight      Height      Head Circumference      Peak Flow      Pain Score      Pain Loc      Pain Edu?      Excl. in GC?    No data found.  Updated Vital Signs BP 123/81 (BP Location: Left Arm)   Pulse 62   Temp 98 F (36.7 C) (Oral)   Resp 17   SpO2 98%   Visual Acuity Right Eye Distance:   Left Eye Distance:   Bilateral Distance:    Right Eye Near:   Left Eye Near:    Bilateral Near:     Physical Exam Constitutional:      General: He is not in acute distress.    Appearance: Normal appearance. He is not  toxic-appearing or diaphoretic.  HENT:     Head: Normocephalic and atraumatic.  Eyes:     Extraocular Movements: Extraocular movements intact.     Conjunctiva/sclera: Conjunctivae normal.  Pulmonary:     Effort: Pulmonary effort is normal.  Genitourinary:    Comments: Deferred with shared decision making.  Self swab performed. Neurological:     General: No focal deficit present.     Mental Status: He is alert and oriented to person, place, and time. Mental status is at baseline.  Psychiatric:        Mood and Affect: Mood normal.        Behavior: Behavior normal.        Thought Content: Thought content normal.        Judgment: Judgment normal.      UC Treatments / Results  Labs (all labs ordered are listed, but only abnormal results are displayed) Labs  Reviewed  RPR  HIV ANTIBODY (ROUTINE TESTING W REFLEX)  CYTOLOGY, (ORAL, ANAL, URETHRAL) ANCILLARY ONLY    EKG   Radiology No results found.  Procedures Procedures (including critical care time)  Medications Ordered in UC Medications - No data to display  Initial Impression / Assessment and Plan / UC Course  I have reviewed the triage vital signs and the nursing notes.  Pertinent labs & imaging results that were available during my care of the patient were reviewed by me and considered in my medical decision making (see chart for details).     Cytology swab, HIV, RPR test pending.  Given no confirmed exposure to STD, will await results for further treatment.  Patient declined UA.  Advised to refrain from sexual activity until test results and treatment are complete.  Patient verbalized understanding and was agreeable with plan. Final Clinical Impressions(s) / UC Diagnoses   Final diagnoses:  Screening examination for venereal disease     Discharge Instructions      Your STD testing is pending.  Will call if anything is positive and treat if necessary.  We ask that you refrain from sexual activity until test  results and treatment are complete.    ED Prescriptions   None    PDMP not reviewed this encounter.   Gustavus Bryant, [place] 09/20/22 1247

## 2022-09-21 LAB — CYTOLOGY, (ORAL, ANAL, URETHRAL) ANCILLARY ONLY
Chlamydia: NEGATIVE
Comment: NEGATIVE
Comment: NEGATIVE
Comment: NORMAL
Neisseria Gonorrhea: NEGATIVE
Trichomonas: NEGATIVE

## 2022-09-21 LAB — RPR: RPR Ser Ql: NONREACTIVE

## 2022-09-21 LAB — HIV ANTIBODY (ROUTINE TESTING W REFLEX): HIV Screen 4th Generation wRfx: NONREACTIVE

## 2022-10-19 ENCOUNTER — Ambulatory Visit
Admission: RE | Admit: 2022-10-19 | Discharge: 2022-10-19 | Disposition: A | Discharge status: Home / Self Care | Payer: Self-pay | Source: Ambulatory Visit | Attending: Emergency Medicine | Admitting: Emergency Medicine

## 2022-10-19 ENCOUNTER — Telehealth: Payer: Self-pay

## 2022-10-19 VITALS — BP 120/81 | HR 82 | Temp 98.0°F | Resp 18

## 2022-10-19 DIAGNOSIS — Z113 Encounter for screening for infections with a predominantly sexual mode of transmission: Secondary | ICD-10-CM | POA: Insufficient documentation

## 2022-10-19 DIAGNOSIS — N368 Other specified disorders of urethra: Secondary | ICD-10-CM | POA: Insufficient documentation

## 2022-10-19 LAB — POCT URINALYSIS DIP (MANUAL ENTRY)
Bilirubin, UA: NEGATIVE
Blood, UA: NEGATIVE
Glucose, UA: NEGATIVE mg/dL
Ketones, POC UA: NEGATIVE mg/dL
Leukocytes, UA: NEGATIVE
Nitrite, UA: NEGATIVE
Protein Ur, POC: NEGATIVE mg/dL
Spec Grav, UA: >=1.03 — AB (ref 1.010–1.025)
Urobilinogen, UA: 0.2 E.U./dL
pH, UA: 6.5 (ref 5.0–8.0)

## 2022-10-19 NOTE — ED Triage Notes (Signed)
Patient here for repeat STD testing. Patient states he was seen 2 weeks ago and did a self swab and everything was negative. Patient here today for a "urine test" for STDs. States he is still having symptoms.

## 2022-10-19 NOTE — ED Provider Notes (Signed)
[facility]    CSN: [id number] Arrival date & time: 10/19/22  1421      History   Chief Complaint Chief Complaint  Patient presents with   SEXUALLY TRANSMITTED DISEASE    Entered by patient    HPI Chris Blankenship is a 37 y.o. male. Pt was seen in UC 09/20/22 for STI testing because he was having "discomfort" with urination. He declined UA at that time, suspecting he had sTI. However, test results did not show STI and pt still has sx. Describes it as an "itchy" feeling that he feels at the end of his urine stream when urinating and he feels his urine is cloudy  HPI  History reviewed. No pertinent past medical history.  There are no problems to display for this patient.   Past Surgical History:  Procedure Laterality Date   APPENDECTOMY         Home Medications    Prior to Admission medications   Not on File    Family History History reviewed. No pertinent family history.  Social History Social History   Tobacco Use   Smoking status: Every Day    Packs/day: 1    Types: Cigarettes  Substance Use Topics   Alcohol use: Yes   Drug use: Yes    Types: Marijuana     Allergies   Patient has no known allergies.   Review of Systems Review of Systems  Constitutional:  Negative for chills and fever.  Gastrointestinal:  Negative for abdominal pain, nausea and vomiting.  Genitourinary:  Negative for flank pain, genital sores and penile discharge.     Physical Exam Triage Vital Signs ED Triage Vitals [10/19/22 1436]  Enc Vitals Group     BP 120/81     Pulse Rate 82     Resp 18     Temp 98 F (36.7 C)     Temp Source Oral     SpO2 97 %     Weight      Height      Head Circumference      Peak Flow      Pain Score 0     Pain Loc      Pain Edu?      Excl. in GC?    No data found.  Updated Vital Signs BP 120/81 (BP Location: Right Arm)   Pulse 82   Temp 98 F (36.7 C) (Oral)   Resp 18   SpO2 97%   Visual Acuity Right Eye Distance:    Left Eye Distance:   Bilateral Distance:    Right Eye Near:   Left Eye Near:    Bilateral Near:     Physical Exam Constitutional:      Appearance: Normal appearance.  Pulmonary:     Effort: Pulmonary effort is normal.  Neurological:     Mental Status: He is alert and oriented to person, place, and time.      UC Treatments / Results  Labs (all labs ordered are listed, but only abnormal results are displayed) Labs Reviewed  POCT URINALYSIS DIP (MANUAL ENTRY) - Abnormal; Notable for the following components:      Result Value   Spec Grav, UA >=1.030 (*)    All other components within normal limits  CYTOLOGY, (ORAL, ANAL, URETHRAL) ANCILLARY ONLY    EKG   Radiology No results found.  Procedures Procedures (including critical care time)  Medications Ordered in UC Medications - No data to display  Initial Impression /  Assessment and Plan / UC Course  I have reviewed the triage vital signs and the nursing notes.  Pertinent labs & imaging results that were available during my care of the patient were reviewed by me and considered in my medical decision making (see chart for details).    UA does not show UTI. Will recheck for gc/chlamydia, trich. I suggested cloudy urine in the absence of uti or sti could be related to dehydration. Encouraged increased po fliuds. If sx persist, can consider sending pt to urology  Final Clinical Impressions(s) / UC Diagnoses   Final diagnoses:  Screening examination for STI  Urethral irritation     Discharge Instructions      You will get a call if tests are positive, you will not get a call if tests are negative but you can check results in MyChart if you have a MyChart account.     ED Prescriptions   None    PDMP not reviewed this encounter.   Cathlyn Parsons, NP 10/19/22 [phone]

## 2022-10-19 NOTE — Discharge Instructions (Signed)
You will get a call if tests are positive, you will not get a call if tests are negative but you can check results in MyChart if you have a MyChart account.   °

## 2022-10-24 LAB — CYTOLOGY, (ORAL, ANAL, URETHRAL) ANCILLARY ONLY
Chlamydia: NEGATIVE
Comment: NEGATIVE
Comment: NEGATIVE
Comment: NORMAL
Neisseria Gonorrhea: NEGATIVE
Trichomonas: NEGATIVE

## 2023-08-17 ENCOUNTER — Ambulatory Visit (HOSPITAL_COMMUNITY)
Admission: EM | Admit: 2023-08-17 | Discharge: 2023-08-17 | Disposition: A | Discharge status: Home / Self Care | Payer: Self-pay | Attending: Emergency Medicine | Admitting: Emergency Medicine

## 2023-08-17 ENCOUNTER — Other Ambulatory Visit: Payer: Self-pay

## 2023-08-17 ENCOUNTER — Encounter (HOSPITAL_COMMUNITY): Payer: Self-pay | Admitting: Emergency Medicine

## 2023-08-17 DIAGNOSIS — R1013 Epigastric pain: Secondary | ICD-10-CM

## 2023-08-17 DIAGNOSIS — K2900 Acute gastritis without bleeding: Secondary | ICD-10-CM

## 2023-08-17 DIAGNOSIS — K279 Peptic ulcer, site unspecified, unspecified as acute or chronic, without hemorrhage or perforation: Secondary | ICD-10-CM

## 2023-08-17 MED ORDER — PANTOPRAZOLE SODIUM 40 MG PO TBEC: 40.0000 mg | DELAYED_RELEASE_TABLET | Freq: Every day | ORAL | 0 refills | Status: AC

## 2023-08-17 MED ORDER — LIDOCAINE VISCOUS HCL 2 % MT SOLN
15.0000 mL | Freq: Once | OROMUCOSAL | Status: AC
  Administered 2023-08-17: 15 mL via OROMUCOSAL

## 2023-08-17 MED ORDER — ALUM & MAG HYDROXIDE-SIMETH 200-200-20 MG/5ML PO SUSP
30.0000 mL | Freq: Once | ORAL | Status: AC
  Administered 2023-08-17: 30 mL via ORAL

## 2023-08-17 MED ORDER — LIDOCAINE VISCOUS HCL 2 % MT SOLN
OROMUCOSAL | Status: AC
  Filled 2023-08-17: qty 15

## 2023-08-17 MED ORDER — ALUM & MAG HYDROXIDE-SIMETH 200-200-20 MG/5ML PO SUSP
ORAL | Status: AC
  Filled 2023-08-17: qty 30

## 2023-08-17 NOTE — ED Triage Notes (Signed)
 Pt c/o epigastric pain for 2 months, denies any nausea or vomiting.

## 2023-08-17 NOTE — ED Provider Notes (Signed)
 [facility]    CSN: [id number] Arrival date & time: 08/17/23  1004      History   Chief Complaint Chief Complaint  Patient presents with   Abdominal Pain    HPI Chris Blankenship is a 38 y.o. male.  Here with 2 month history of epigastric abdominal pain Pain comes and goes, has woken him from sleep. Currently rating 7/10 Denies nausea or vomiting. No diarrhea/constipation Not having fever Denies prior history of this  Does report symptoms after eating [place] food  History reviewed. No pertinent past medical history.  There are no active problems to display for this patient.   Past Surgical History:  Procedure Laterality Date   APPENDECTOMY      Home Medications    Prior to Admission medications   Medication Sig Start Date End Date Taking? Authorizing Provider  pantoprazole  (PROTONIX ) 40 MG tablet Take 1 tablet (40 mg total) by mouth daily for 14 days. 08/17/23 08/31/23 Yes Naoko Diperna, Beth Brooke    Family History History reviewed. No pertinent family history.  Social History Social History   Tobacco Use   Smoking status: Every Day    Current packs/day: 1.00    Types: Cigarettes  Substance Use Topics   Alcohol use: Yes   Drug use: Yes    Types: Marijuana     Allergies   Patient has no known allergies.   Review of Systems Review of Systems  Gastrointestinal:  Positive for abdominal pain.   Per HPI  Physical Exam Triage Vital Signs ED Triage Vitals  Encounter Vitals Group     BP      Systolic BP Percentile      Diastolic BP Percentile      Pulse      Resp      Temp      Temp src      SpO2      Weight      Height      Head Circumference      Peak Flow      Pain Score      Pain Loc      Pain Education      Exclude from Growth Chart    No data found.  Updated Vital Signs BP 125/78 (BP Location: Right Arm)   Pulse 78   Temp 98.7 F (37.1 C) (Oral)   Resp 16   SpO2 97%   Physical Exam Vitals and nursing note reviewed.   Constitutional:      Appearance: Normal appearance. He is not ill-appearing or diaphoretic.  HENT:     Mouth/Throat:     Mouth: Mucous membranes are moist.     Pharynx: Oropharynx is clear.  Eyes:     Conjunctiva/sclera: Conjunctivae normal.  Cardiovascular:     Rate and Rhythm: Normal rate and regular rhythm.     Heart sounds: Normal heart sounds.  Pulmonary:     Effort: Pulmonary effort is normal.     Breath sounds: Normal breath sounds.  Abdominal:     General: Bowel sounds are normal.     Tenderness: There is abdominal tenderness in the epigastric area. There is no right CVA tenderness, left CVA tenderness, guarding or rebound.     Comments: Habitus limits exam  Musculoskeletal:        General: Normal range of motion.  Skin:    General: Skin is warm and dry.  Neurological:     Mental Status: He is alert and oriented to person,  place, and time.     UC Treatments / Results  Labs (all labs ordered are listed, but only abnormal results are displayed) Labs Reviewed - No data to display  EKG  Radiology No results found.  Procedures Procedures  Medications Ordered in UC Medications  alum & mag hydroxide-simeth (MAALOX/MYLANTA) 200-200-20 MG/5ML suspension 30 mL (30 mLs Oral Given 08/17/23 1053)  lidocaine  (XYLOCAINE ) 2 % viscous mouth solution 15 mL (15 mLs Mouth/Throat Given 08/17/23 1053)    Initial Impression / Assessment and Plan / UC Course  I have reviewed the triage vital signs and the nursing notes.  Pertinent labs & imaging results that were available during my care of the patient were reviewed by me and considered in my medical decision making (see chart for details).  Afebrile, stable vitals, well appearing  GI cocktail given with resolution of pain Discussed possible ulcer etiology - recommend Protonix  daily x 14 days, pepcid BID prn, bland diet, and follow up with GI. Advised establishing with primary care as well. Return and ED precautions discussed.  Patient agrees to plan, no questions   Final Clinical Impressions(s) / UC Diagnoses   Final diagnoses:  Abdominal pain, epigastric  Peptic ulcer  Acute gastritis without hemorrhage, unspecified gastritis type     Discharge Instructions      Protonix  (pantoprazole ) once every morning for the next 14 days. It can take 1-3 days to start working. Take first thing in the morning on an empty stomach.  Remain upright for 30 minutes after taking medicine.  Please take the full 14-day course.  You can use famotidine/Pepcid at home as needed twice daily  Bland diet for the next 2 weeks.  Avoid fatty, greasy, citrusy, spicy, caffeine and alcohol as these can worsen symptoms.  Do not use any ibuprofen /Advil  or other NSAIDs as these can worsen symptoms.  Please follow up with a gastroenterologist (stomach specialist)   If at any point your symptoms worsen, including severe pain or inability to tolerate fluids, please go directly to the emergency department.  You can scan the QR code on the last page to get established with a primary care provider      ED Prescriptions     Medication Sig Dispense Auth. Provider   pantoprazole  (PROTONIX ) 40 MG tablet Take 1 tablet (40 mg total) by mouth daily for 14 days. 14 tablet Hillary Struss, Ivette Marks, PA-C      PDMP not reviewed this encounter.   Vermell Madrid, Ivette Marks, [place] 08/17/23 1104

## 2023-08-17 NOTE — Discharge Instructions (Addendum)
 Protonix  (pantoprazole ) once every morning for the next 14 days. It can take 1-3 days to start working. Take first thing in the morning on an empty stomach.  Remain upright for 30 minutes after taking medicine.  Please take the full 14-day course.  You can use famotidine/Pepcid at home as needed twice daily  Bland diet for the next 2 weeks.  Avoid fatty, greasy, citrusy, spicy, caffeine and alcohol as these can worsen symptoms.  Do not use any ibuprofen /Advil  or other NSAIDs as these can worsen symptoms.  Please follow up with a gastroenterologist (stomach specialist)   If at any point your symptoms worsen, including severe pain or inability to tolerate fluids, please go directly to the emergency department.  You can scan the QR code on the last page to get established with a primary care provider
